# Patient Record
Sex: Female | Born: 1980 | Race: Black or African American | Hispanic: No | Marital: Single | Smoking: Current every day smoker
Health system: Southern US, Community
[De-identification: ages and names within clinical notes are randomized; demographics above are authoritative.]

## PROBLEM LIST (undated history)

## (undated) DIAGNOSIS — S82891A Other fracture of right lower leg, initial encounter for closed fracture: Secondary | ICD-10-CM

## (undated) DIAGNOSIS — Z8349 Family history of other endocrine, nutritional and metabolic diseases: Secondary | ICD-10-CM

## (undated) DIAGNOSIS — Z30013 Encounter for initial prescription of injectable contraceptive: Secondary | ICD-10-CM

## (undated) DIAGNOSIS — Z124 Encounter for screening for malignant neoplasm of cervix: Secondary | ICD-10-CM

## (undated) DIAGNOSIS — K37 Unspecified appendicitis: Secondary | ICD-10-CM

## (undated) DIAGNOSIS — Z9049 Acquired absence of other specified parts of digestive tract: Secondary | ICD-10-CM

## (undated) DIAGNOSIS — E559 Vitamin D deficiency, unspecified: Secondary | ICD-10-CM

## (undated) HISTORY — PX: CHOLECYSTECTOMY: SHX55

## (undated) HISTORY — PX: APPENDECTOMY: SHX54

## (undated) HISTORY — DX: Other fracture of right lower leg, initial encounter for closed fracture: S82.891A

## (undated) HISTORY — DX: Family history of other endocrine, nutritional and metabolic diseases: Z83.49

---
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
## 2002-05-10 ENCOUNTER — Encounter (INDEPENDENT_AMBULATORY_CARE_PROVIDER_SITE_OTHER): Payer: Self-pay | Admitting: Specialist

## 2002-05-10 ENCOUNTER — Encounter: Payer: Self-pay | Admitting: Emergency Medicine

## 2002-05-10 ENCOUNTER — Inpatient Hospital Stay (HOSPITAL_COMMUNITY): Admission: EM | Admit: 2002-05-10 | Discharge: 2002-05-14 | Payer: Self-pay | Admitting: Emergency Medicine

## 2002-06-02 ENCOUNTER — Emergency Department (HOSPITAL_COMMUNITY): Admission: EM | Admit: 2002-06-02 | Discharge: 2002-06-02 | Payer: Self-pay | Admitting: Emergency Medicine

## 2002-06-14 ENCOUNTER — Encounter: Admission: RE | Admit: 2002-06-14 | Discharge: 2002-06-14 | Payer: Self-pay | Admitting: Obstetrics and Gynecology

## 2002-10-23 ENCOUNTER — Encounter (INDEPENDENT_AMBULATORY_CARE_PROVIDER_SITE_OTHER): Payer: Self-pay | Admitting: *Deleted

## 2002-10-23 LAB — CONVERTED CEMR LAB

## 2002-11-02 ENCOUNTER — Encounter: Admission: RE | Admit: 2002-11-02 | Discharge: 2002-11-02 | Payer: Self-pay | Admitting: Family Medicine

## 2002-11-09 ENCOUNTER — Encounter: Admission: RE | Admit: 2002-11-09 | Discharge: 2002-11-09 | Payer: Self-pay | Admitting: Family Medicine

## 2002-12-06 ENCOUNTER — Encounter: Admission: RE | Admit: 2002-12-06 | Discharge: 2002-12-06 | Payer: Self-pay | Admitting: Family Medicine

## 2002-12-23 ENCOUNTER — Encounter: Admission: RE | Admit: 2002-12-23 | Discharge: 2002-12-23 | Payer: Self-pay | Admitting: Family Medicine

## 2003-01-18 ENCOUNTER — Encounter: Admission: RE | Admit: 2003-01-18 | Discharge: 2003-01-18 | Payer: Self-pay | Admitting: Family Medicine

## 2003-01-25 ENCOUNTER — Encounter: Admission: RE | Admit: 2003-01-25 | Discharge: 2003-01-25 | Payer: Self-pay | Admitting: Family Medicine

## 2003-04-06 ENCOUNTER — Encounter: Admission: RE | Admit: 2003-04-06 | Discharge: 2003-04-06 | Payer: Self-pay | Admitting: Obstetrics and Gynecology

## 2003-06-05 ENCOUNTER — Ambulatory Visit (HOSPITAL_COMMUNITY): Admission: RE | Admit: 2003-06-05 | Discharge: 2003-06-05 | Payer: Self-pay | Admitting: Obstetrics and Gynecology

## 2003-06-05 ENCOUNTER — Encounter (INDEPENDENT_AMBULATORY_CARE_PROVIDER_SITE_OTHER): Payer: Self-pay | Admitting: Specialist

## 2003-06-20 ENCOUNTER — Encounter: Admission: RE | Admit: 2003-06-20 | Discharge: 2003-06-20 | Payer: Self-pay | Admitting: Obstetrics and Gynecology

## 2003-07-11 ENCOUNTER — Encounter: Admission: RE | Admit: 2003-07-11 | Discharge: 2003-07-11 | Payer: Self-pay | Admitting: Obstetrics and Gynecology

## 2004-01-21 ENCOUNTER — Emergency Department (HOSPITAL_COMMUNITY): Admission: EM | Admit: 2004-01-21 | Discharge: 2004-01-21 | Payer: Self-pay | Admitting: Emergency Medicine

## 2004-03-14 ENCOUNTER — Emergency Department (HOSPITAL_COMMUNITY): Admission: EM | Admit: 2004-03-14 | Discharge: 2004-03-14 | Payer: Self-pay | Admitting: *Deleted

## 2004-03-15 ENCOUNTER — Emergency Department (HOSPITAL_COMMUNITY): Admission: EM | Admit: 2004-03-15 | Discharge: 2004-03-15 | Payer: Self-pay | Admitting: Emergency Medicine

## 2004-03-16 ENCOUNTER — Emergency Department (HOSPITAL_COMMUNITY): Admission: EM | Admit: 2004-03-16 | Discharge: 2004-03-16 | Payer: Self-pay | Admitting: Emergency Medicine

## 2004-03-20 ENCOUNTER — Emergency Department (HOSPITAL_COMMUNITY): Admission: EM | Admit: 2004-03-20 | Discharge: 2004-03-20 | Payer: Self-pay

## 2004-03-21 ENCOUNTER — Emergency Department (HOSPITAL_COMMUNITY): Admission: EM | Admit: 2004-03-21 | Discharge: 2004-03-21 | Payer: Self-pay | Admitting: Family Medicine

## 2004-08-22 ENCOUNTER — Emergency Department (HOSPITAL_COMMUNITY): Admission: EM | Admit: 2004-08-22 | Discharge: 2004-08-22 | Payer: Self-pay | Admitting: Emergency Medicine

## 2004-09-05 ENCOUNTER — Emergency Department (HOSPITAL_COMMUNITY): Admission: EM | Admit: 2004-09-05 | Discharge: 2004-09-05 | Payer: Self-pay | Admitting: Emergency Medicine

## 2004-09-30 ENCOUNTER — Emergency Department (HOSPITAL_COMMUNITY): Admission: EM | Admit: 2004-09-30 | Discharge: 2004-10-01 | Payer: Self-pay | Admitting: Emergency Medicine

## 2005-04-13 ENCOUNTER — Emergency Department (HOSPITAL_COMMUNITY): Admission: EM | Admit: 2005-04-13 | Discharge: 2005-04-13 | Payer: Self-pay | Admitting: Emergency Medicine

## 2005-04-24 ENCOUNTER — Ambulatory Visit: Payer: Self-pay | Admitting: Family Medicine

## 2005-04-24 ENCOUNTER — Other Ambulatory Visit: Admission: RE | Admit: 2005-04-24 | Discharge: 2005-04-24 | Payer: Self-pay | Admitting: Obstetrics and Gynecology

## 2005-04-24 ENCOUNTER — Encounter (INDEPENDENT_AMBULATORY_CARE_PROVIDER_SITE_OTHER): Payer: Self-pay | Admitting: *Deleted

## 2005-11-24 ENCOUNTER — Emergency Department (HOSPITAL_COMMUNITY): Admission: EM | Admit: 2005-11-24 | Discharge: 2005-11-24 | Payer: Self-pay | Admitting: Emergency Medicine

## 2006-02-03 ENCOUNTER — Ambulatory Visit: Payer: Self-pay | Admitting: Family Medicine

## 2006-03-10 ENCOUNTER — Ambulatory Visit: Payer: Self-pay | Admitting: Family Medicine

## 2006-03-20 ENCOUNTER — Ambulatory Visit: Payer: Self-pay | Admitting: *Deleted

## 2006-04-20 ENCOUNTER — Encounter (INDEPENDENT_AMBULATORY_CARE_PROVIDER_SITE_OTHER): Payer: Self-pay | Admitting: Family Medicine

## 2006-04-20 ENCOUNTER — Ambulatory Visit: Payer: Self-pay | Admitting: Family Medicine

## 2006-04-20 ENCOUNTER — Encounter (INDEPENDENT_AMBULATORY_CARE_PROVIDER_SITE_OTHER): Payer: Self-pay | Admitting: Specialist

## 2006-04-28 ENCOUNTER — Ambulatory Visit: Payer: Self-pay | Admitting: Family Medicine

## 2006-05-21 DIAGNOSIS — E669 Obesity, unspecified: Secondary | ICD-10-CM

## 2006-05-22 ENCOUNTER — Encounter (INDEPENDENT_AMBULATORY_CARE_PROVIDER_SITE_OTHER): Payer: Self-pay | Admitting: *Deleted

## 2006-08-09 ENCOUNTER — Emergency Department (HOSPITAL_COMMUNITY): Admission: EM | Admit: 2006-08-09 | Discharge: 2006-08-09 | Payer: Self-pay | Admitting: Emergency Medicine

## 2006-11-26 ENCOUNTER — Inpatient Hospital Stay (HOSPITAL_COMMUNITY): Admission: AD | Admit: 2006-11-26 | Discharge: 2006-11-27 | Payer: Self-pay | Admitting: Obstetrics and Gynecology

## 2006-12-09 ENCOUNTER — Encounter (INDEPENDENT_AMBULATORY_CARE_PROVIDER_SITE_OTHER): Payer: Self-pay | Admitting: *Deleted

## 2007-04-28 ENCOUNTER — Other Ambulatory Visit: Admission: RE | Admit: 2007-04-28 | Discharge: 2007-04-28 | Payer: Self-pay | Admitting: Gynecology

## 2007-08-02 ENCOUNTER — Other Ambulatory Visit: Admission: RE | Admit: 2007-08-02 | Discharge: 2007-08-02 | Payer: Self-pay | Admitting: Gynecology

## 2007-10-19 ENCOUNTER — Emergency Department (HOSPITAL_COMMUNITY): Admission: EM | Admit: 2007-10-19 | Discharge: 2007-10-19 | Payer: Self-pay | Admitting: Emergency Medicine

## 2007-12-16 ENCOUNTER — Emergency Department (HOSPITAL_COMMUNITY): Admission: EM | Admit: 2007-12-16 | Discharge: 2007-12-16 | Payer: Self-pay | Admitting: Emergency Medicine

## 2008-01-08 ENCOUNTER — Inpatient Hospital Stay (HOSPITAL_COMMUNITY): Admission: AD | Admit: 2008-01-08 | Discharge: 2008-01-08 | Payer: Self-pay | Admitting: Obstetrics & Gynecology

## 2008-05-15 ENCOUNTER — Inpatient Hospital Stay (HOSPITAL_COMMUNITY): Admission: AD | Admit: 2008-05-15 | Discharge: 2008-05-15 | Payer: Self-pay | Admitting: Obstetrics & Gynecology

## 2008-05-15 ENCOUNTER — Ambulatory Visit: Payer: Self-pay | Admitting: Obstetrics and Gynecology

## 2008-06-21 ENCOUNTER — Inpatient Hospital Stay (HOSPITAL_COMMUNITY): Admission: AD | Admit: 2008-06-21 | Discharge: 2008-06-21 | Payer: Self-pay | Admitting: Obstetrics and Gynecology

## 2008-06-23 ENCOUNTER — Inpatient Hospital Stay (HOSPITAL_COMMUNITY): Admission: AD | Admit: 2008-06-23 | Discharge: 2008-06-24 | Payer: Self-pay | Admitting: Family Medicine

## 2008-06-23 ENCOUNTER — Inpatient Hospital Stay (HOSPITAL_COMMUNITY): Admission: AD | Admit: 2008-06-23 | Discharge: 2008-06-23 | Payer: Self-pay | Admitting: Obstetrics & Gynecology

## 2008-06-26 ENCOUNTER — Ambulatory Visit: Payer: Self-pay | Admitting: Physician Assistant

## 2008-06-26 ENCOUNTER — Inpatient Hospital Stay (HOSPITAL_COMMUNITY): Admission: AD | Admit: 2008-06-26 | Discharge: 2008-06-26 | Payer: Self-pay | Admitting: Obstetrics & Gynecology

## 2008-06-28 ENCOUNTER — Inpatient Hospital Stay (HOSPITAL_COMMUNITY): Admission: AD | Admit: 2008-06-28 | Discharge: 2008-06-28 | Payer: Self-pay | Admitting: Obstetrics & Gynecology

## 2008-08-12 ENCOUNTER — Emergency Department (HOSPITAL_COMMUNITY): Admission: EM | Admit: 2008-08-12 | Discharge: 2008-08-12 | Payer: Self-pay | Admitting: Emergency Medicine

## 2008-08-23 ENCOUNTER — Inpatient Hospital Stay (HOSPITAL_COMMUNITY): Admission: AD | Admit: 2008-08-23 | Discharge: 2008-08-24 | Payer: Self-pay | Admitting: Obstetrics and Gynecology

## 2008-09-16 ENCOUNTER — Emergency Department (HOSPITAL_COMMUNITY): Admission: EM | Admit: 2008-09-16 | Discharge: 2008-09-17 | Payer: Self-pay | Admitting: Emergency Medicine

## 2009-01-16 ENCOUNTER — Inpatient Hospital Stay (HOSPITAL_COMMUNITY): Admission: AD | Admit: 2009-01-16 | Discharge: 2009-01-16 | Payer: Self-pay | Admitting: Obstetrics & Gynecology

## 2009-04-05 ENCOUNTER — Emergency Department (HOSPITAL_COMMUNITY): Admission: EM | Admit: 2009-04-05 | Discharge: 2009-04-05 | Payer: Self-pay | Admitting: Emergency Medicine

## 2009-04-21 ENCOUNTER — Emergency Department (HOSPITAL_COMMUNITY): Admission: EM | Admit: 2009-04-21 | Discharge: 2009-04-21 | Payer: Self-pay | Admitting: Emergency Medicine

## 2009-05-13 ENCOUNTER — Emergency Department (HOSPITAL_COMMUNITY): Admission: EM | Admit: 2009-05-13 | Discharge: 2009-05-13 | Payer: Self-pay | Admitting: Emergency Medicine

## 2009-06-15 ENCOUNTER — Emergency Department (HOSPITAL_COMMUNITY): Admission: EM | Admit: 2009-06-15 | Discharge: 2009-06-15 | Payer: Self-pay | Admitting: Emergency Medicine

## 2009-06-19 ENCOUNTER — Emergency Department (HOSPITAL_COMMUNITY): Admission: EM | Admit: 2009-06-19 | Discharge: 2009-06-20 | Payer: Self-pay | Admitting: Emergency Medicine

## 2010-06-08 LAB — URINALYSIS, ROUTINE W REFLEX MICROSCOPIC
Glucose, UA: NEGATIVE mg/dL
Hgb urine dipstick: NEGATIVE
Ketones, ur: 15 mg/dL — AB
Leukocytes, UA: NEGATIVE
Nitrite: NEGATIVE
Protein, ur: 30 mg/dL — AB
Specific Gravity, Urine: 1.03 (ref 1.005–1.030)
Urobilinogen, UA: 1 mg/dL (ref 0.0–1.0)
pH: 6.5 (ref 5.0–8.0)

## 2010-06-08 LAB — URINE MICROSCOPIC-ADD ON

## 2010-06-09 LAB — RAPID URINE DRUG SCREEN, HOSP PERFORMED
Amphetamines: NOT DETECTED
Barbiturates: NOT DETECTED
Benzodiazepines: NOT DETECTED
Cocaine: NOT DETECTED
Opiates: NOT DETECTED
Tetrahydrocannabinol: POSITIVE — AB

## 2010-06-09 LAB — URINALYSIS, ROUTINE W REFLEX MICROSCOPIC
Bilirubin Urine: NEGATIVE
Glucose, UA: NEGATIVE mg/dL
Hgb urine dipstick: NEGATIVE
Ketones, ur: NEGATIVE mg/dL
Nitrite: NEGATIVE
Protein, ur: NEGATIVE mg/dL
Specific Gravity, Urine: 1.021 (ref 1.005–1.030)
Urobilinogen, UA: 0.2 mg/dL (ref 0.0–1.0)
pH: 6.5 (ref 5.0–8.0)

## 2010-06-09 LAB — POCT I-STAT, CHEM 8
BUN: 6 mg/dL (ref 6–23)
Calcium, Ion: 1.21 mmol/L (ref 1.12–1.32)
Chloride: 108 mEq/L (ref 96–112)
Creatinine, Ser: 0.8 mg/dL (ref 0.4–1.2)
Glucose, Bld: 90 mg/dL (ref 70–99)
HCT: 36 % (ref 36.0–46.0)
Hemoglobin: 12.2 g/dL (ref 12.0–15.0)
Potassium: 3.6 mEq/L (ref 3.5–5.1)
Sodium: 140 mEq/L (ref 135–145)
TCO2: 25 mmol/L (ref 0–100)

## 2010-06-09 LAB — URINE MICROSCOPIC-ADD ON

## 2010-06-09 LAB — POCT PREGNANCY, URINE: Preg Test, Ur: NEGATIVE

## 2010-06-27 LAB — URINE MICROSCOPIC-ADD ON

## 2010-06-27 LAB — URINALYSIS, ROUTINE W REFLEX MICROSCOPIC
Glucose, UA: NEGATIVE mg/dL
Ketones, ur: 15 mg/dL — AB
Leukocytes, UA: NEGATIVE
Nitrite: NEGATIVE
Protein, ur: 30 mg/dL — AB
Specific Gravity, Urine: 1.02 (ref 1.005–1.030)
Urobilinogen, UA: 0.2 mg/dL (ref 0.0–1.0)
pH: 6.5 (ref 5.0–8.0)

## 2010-06-27 LAB — WET PREP, GENITAL: Yeast Wet Prep HPF POC: NONE SEEN

## 2010-06-27 LAB — GC/CHLAMYDIA PROBE AMP, GENITAL
Chlamydia, DNA Probe: NEGATIVE
GC Probe Amp, Genital: NEGATIVE

## 2010-06-27 LAB — POCT PREGNANCY, URINE: Preg Test, Ur: NEGATIVE

## 2010-07-01 LAB — WET PREP, GENITAL
Trich, Wet Prep: NONE SEEN
Yeast Wet Prep HPF POC: NONE SEEN

## 2010-07-01 LAB — GC/CHLAMYDIA PROBE AMP, GENITAL
Chlamydia, DNA Probe: NEGATIVE
GC Probe Amp, Genital: NEGATIVE

## 2010-07-02 LAB — DIFFERENTIAL
Basophils Relative: 1 % (ref 0–1)
Eosinophils Absolute: 0 10*3/uL (ref 0.0–0.7)
Eosinophils Relative: 1 % (ref 0–5)
Neutrophils Relative %: 60 % (ref 43–77)

## 2010-07-02 LAB — PREGNANCY, URINE: Preg Test, Ur: NEGATIVE

## 2010-07-02 LAB — CBC
MCHC: 31.7 g/dL (ref 30.0–36.0)
MCV: 73.9 fL — ABNORMAL LOW (ref 78.0–100.0)
Platelets: 239 10*3/uL (ref 150–400)
RDW: 15.3 % (ref 11.5–15.5)

## 2010-07-02 LAB — BASIC METABOLIC PANEL
BUN: 8 mg/dL (ref 6–23)
CO2: 25 mEq/L (ref 19–32)
Chloride: 107 mEq/L (ref 96–112)
Creatinine, Ser: 0.86 mg/dL (ref 0.4–1.2)
Glucose, Bld: 90 mg/dL (ref 70–99)

## 2010-07-02 LAB — AMMONIA: Ammonia: 22 umol/L (ref 11–35)

## 2010-07-03 LAB — CBC
HCT: 33.4 % — ABNORMAL LOW (ref 36.0–46.0)
HCT: 34.1 % — ABNORMAL LOW (ref 36.0–46.0)
Hemoglobin: 10.6 g/dL — ABNORMAL LOW (ref 12.0–15.0)
Hemoglobin: 10.9 g/dL — ABNORMAL LOW (ref 12.0–15.0)
MCV: 74 fL — ABNORMAL LOW (ref 78.0–100.0)
Platelets: 266 10*3/uL (ref 150–400)
RBC: 4.47 MIL/uL (ref 3.87–5.11)
RDW: 15.9 % — ABNORMAL HIGH (ref 11.5–15.5)
RDW: 15.9 % — ABNORMAL HIGH (ref 11.5–15.5)
WBC: 5.9 10*3/uL (ref 4.0–10.5)

## 2010-07-03 LAB — DIFFERENTIAL
Basophils Absolute: 0.1 10*3/uL (ref 0.0–0.1)
Eosinophils Relative: 1 % (ref 0–5)
Lymphocytes Relative: 30 % (ref 12–46)
Lymphs Abs: 1.8 10*3/uL (ref 0.7–4.0)
Monocytes Absolute: 0.5 10*3/uL (ref 0.1–1.0)
Monocytes Relative: 9 % (ref 3–12)
Neutro Abs: 3.5 10*3/uL (ref 1.7–7.7)

## 2010-07-03 LAB — URINALYSIS, ROUTINE W REFLEX MICROSCOPIC
Bilirubin Urine: NEGATIVE
Ketones, ur: 15 mg/dL — AB
Nitrite: NEGATIVE
pH: 7 (ref 5.0–8.0)

## 2010-07-03 LAB — URINE MICROSCOPIC-ADD ON

## 2010-07-03 LAB — HCG, QUANTITATIVE, PREGNANCY: hCG, Beta Chain, Quant, S: 111 m[IU]/mL — ABNORMAL HIGH (ref <5)

## 2010-07-03 LAB — BASIC METABOLIC PANEL
Calcium: 9 mg/dL (ref 8.4–10.5)
GFR calc Af Amer: >60 mL/min (ref >60)
GFR calc non Af Amer: >60 mL/min (ref >60)
Glucose, Bld: 89 mg/dL (ref 70–99)
Potassium: 3.5 mEq/L (ref 3.5–5.1)
Sodium: 135 mEq/L (ref 135–145)

## 2010-07-04 LAB — URINE MICROSCOPIC-ADD ON

## 2010-07-04 LAB — POCT PREGNANCY, URINE: Preg Test, Ur: POSITIVE

## 2010-07-04 LAB — URINE CULTURE: Colony Count: 45000

## 2010-07-04 LAB — CBC
HCT: 35.4 % — ABNORMAL LOW (ref 36.0–46.0)
MCV: 74.7 fL — ABNORMAL LOW (ref 78.0–100.0)
Platelets: 288 10*3/uL (ref 150–400)
RBC: 4.74 MIL/uL (ref 3.87–5.11)
WBC: 5.5 10*3/uL (ref 4.0–10.5)

## 2010-07-04 LAB — URINALYSIS, ROUTINE W REFLEX MICROSCOPIC
Bilirubin Urine: NEGATIVE
Glucose, UA: NEGATIVE mg/dL
Specific Gravity, Urine: >1.03 — ABNORMAL HIGH (ref 1.005–1.030)
Urobilinogen, UA: 0.2 mg/dL (ref 0.0–1.0)
pH: 5.5 (ref 5.0–8.0)

## 2010-07-04 LAB — HCG, QUANTITATIVE, PREGNANCY: hCG, Beta Chain, Quant, S: 55 m[IU]/mL — ABNORMAL HIGH (ref <5)

## 2010-07-04 LAB — WET PREP, GENITAL: Clue Cells Wet Prep HPF POC: NONE SEEN

## 2010-07-04 LAB — GC/CHLAMYDIA PROBE AMP, GENITAL: Chlamydia, DNA Probe: NEGATIVE

## 2010-07-09 LAB — GC/CHLAMYDIA PROBE AMP, GENITAL: Chlamydia, DNA Probe: NEGATIVE

## 2010-07-09 LAB — URINALYSIS, ROUTINE W REFLEX MICROSCOPIC
Bilirubin Urine: NEGATIVE
Ketones, ur: NEGATIVE mg/dL
Nitrite: NEGATIVE
Specific Gravity, Urine: 1.025 (ref 1.005–1.030)
Urobilinogen, UA: 0.2 mg/dL (ref 0.0–1.0)
pH: 5.5 (ref 5.0–8.0)

## 2010-07-09 LAB — WET PREP, GENITAL

## 2010-08-09 NOTE — Op Note (Signed)
NAME:  [name], [name]                         ACCOUNT NO.:  [id number]   MEDICAL RECORD NO.:  [id number]                   PATIENT TYPE:  AMB   LOCATION:  SDC                                  FACILITY:  [facility]   PHYSICIAN:  Phil D. Okey Dupre, M.D.                  DATE OF BIRTH:  January 27, 1981   DATE OF PROCEDURE:  06/05/2003  DATE OF DISCHARGE:                                 OPERATIVE REPORT   PREOPERATIVE DIAGNOSIS:  Cervical dysplasia.   POSTOPERATIVE DIAGNOSIS:  Pending pathology.   PROCEDURE:  Loop electrosurgical excision procedure conization of the  cervix.   SURGEON:  Javier Glazier. Okey Dupre, M.D.   ANESTHESIA:  MAC plus local.   ESTIMATED BLOOD LOSS:  Less than 5 mL.   POSTOPERATIVE CONDITION:  Satisfactory.   The procedure went as follows:  Under satisfactory MAC anesthesia, the  patient in the dorsal lithotomy position, the perineum and vagina were  prepped and draped in the usual sterile manner.  An insulated Graves  speculum was placed into the vagina so that the cervix was in the middle of  the viewing area and easily accessible and treated with 3% acetic acid and  using 1 mL in each area of four areas at 10, 2, 4, and 8 o'clock on the  cervix was injected 1% Xylocaine with 1:100,000 epinephrine.  A 2 x 2 LEEP  loop was used to do a LEEP biopsy, which was done and sent for pathologic  diagnosis.  The area was then treated around the entire perimeter of the  biopsy site with a ball coagulation set at 50.  The biopsy was set at a  blend 1 of 60, and any bleeding areas were coagulated with the hot cautery.  The area was then packed and held for five minutes with Monsel's solution.  There was no significant bleeding.  The patient was sent to the recovery  room in satisfactory condition after the speculum removed.                                               Phil D. Okey Dupre, M.D.    PDR/MEDQ  D:  06/05/2003  T:  06/06/2003  Job:  [id number]

## 2010-08-09 NOTE — H&P (Signed)
NAME:  [name], [name]                         ACCOUNT NO.:  [id number]   MEDICAL RECORD NO.:  [id number]                   PATIENT TYPE:  INP   LOCATION:  1824                                 FACILITY:  [facility]   PHYSICIAN:  Sandria Bales. Ezzard Standing, M.D.               DATE OF BIRTH:  10-30-1980   DATE OF ADMISSION:  05/10/2002  DATE OF DISCHARGE:                                HISTORY & PHYSICAL   HISTORY OF PRESENT ILLNESS:  She is a 30 year old black female, who has no  primary medical doctor, who was brought to the emergency room by ambulance  because of abdominal pain.   Her symptoms began [facility] the [facility].  She does not know the physician who is  involved.  She started having pain that evening.  She had some diarrhea  first and then started vomiting.  Then she had increasing abdominal pain.  It is a little unclear to me why she did not come to the emergency room  earlier.  Anyway, she presented today around noon via ambulance.  She said  when she hit bumps her belly hurts.  She has had no prior history of peptic  ulcer disease, liver disease, pancreatitis disease.  She has had no prior  abdominal surgery.  Denies any prior pregnancies.  Last menstrual period was  nine days ago.   ALLERGIES:  She has no allergies.   MEDICATIONS:  No medications.   REVIEW OF SYMPTOMS:  PULMONARY:  Does not smoke cigarettes.  No history of  pneumonia or tuberculosis.  CARDIAC:  She has no history of heart disease or  chest pain.  GASTROINTESTINAL:  See history of present illness.  UROLOGIC:  No kidney stones or kidney infections.  GYNECOLOGIC:  She is a nullipara  female and her last period was seven days ago.  I spoke to her mother by  phone but she had nobody with her.   PHYSICAL EXAMINATION:  VITAL SIGNS:  Temperature is 98.4, blood pressure is  107/42, pulse is 121, respirations are 20.  GENERAL:  She is an obese black  female.  HEENT:  Unremarkable.  NECK:  Supple.  I feel no thyromegaly.  LUNGS:  Clear to auscultation.  HEART:  Regular rate and rhythm.  ABDOMEN:  She has absent bowel sounds with maybe to minimal in the left  upper quadrant.  She has some diffuse tenderness more localized in the lower  abdomen.  It was a little hard for me to know whether her right lower  quadrant or left lower quadrant is more tender.  PELVIC:  She had a pelvic exam in the emergency room before I came there.  Apparently, no gynecologic findings.  EXTREMITIES:  She has good strength in all four extremities.   LABORATORY DATA:  Urine pregnancy which was negative.  She had a urinalysis  which was negative.  She had a sodium 133, potassium 3.9, chloride of 103,  CO2 20.  Her creatinine is 1.3.  She does not have a CBC on the chart.   I reviewed her CAT scan which was read as appendicitis.  Actually, her  appendix is more prominent .  She has sort of light areas over her small  bowel.   IMPRESSION:  My impression is either this is early appendicitis versus  gastroenteritis.  I really think I am a little bit stuck with the diagnosis  of possible appendicitis by her CT scan.  She would be best served by having  attempted appendectomy with laparoscopic open.  There is also a chance that  this could be a bad gastroenteritis.  I think she is best served probably  with proceeding with appendectomy because of the suggestion of appendicitis  by CT scan.  I have discussed the indication to surgery, potential  complications including but not limited to infection, bleeding, having to do  the surgery open, and having another diagnosis as actually the cause of her  symptoms. I again spoke to her mother by phone while she was in the room.  I  am not really sure why but her mother is not going to come to the hospital  as she has to work tomorrow.                                               Sandria Bales. Ezzard Standing, M.D.    DHN/MEDQ   D:  05/10/2002  T:  05/10/2002  Job:  [id number]

## 2010-08-09 NOTE — Discharge Summary (Signed)
[facility]  Patient:    [name], [name]                        MRN: [id number] Adm. Date:  10/07/00 Disc. Date: 10/09/00 Attending:  Farley Ly, M.D. Dictator:   Jennette Kettle, M.D. CCEnrigue Catena H. Pollyann Kennedy, M.D.   Discharge Summary  DISCHARGE DIAGNOSES: 1. Left submandibular/submental neck mass, likely infectious area. 2. 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 b.i.d. x 11 days, ending on October 20, 2000.  FOLLOW-UP:  A follow-up appointment will be made with Dr. Pollyann Kennedy (ears, nose, and throat physician).  Follow-up will be made with Dr. Pollyann Kennedy on Monday for follow-up in one to two weeks in their clinic office.  The patient will be telephoned on Monday, October 12, 2000, at home with the follow-up appointment dates.  The patients telephone number is [phone].  PROCEDURES:  None.  CONSULTATIONS:  Dr. Pollyann Kennedy (ENT physician).  HISTORY OF PRESENT ILLNESS:  This is an 30 year old African-American female who was admitted on October 07, 2000, with an increasing left neck swelling.  The patient states that she first noticed a knot on her neck approximately five days prior to admission underneath her jaw.  The patient states that on Monday, September 28, 2000, she used a stick of "chapstick," which caused her mouth to break out.  The patient states that sores erupted that were pustular in nature and "itchy."  By Friday, October 02, 2000, the patient noticed the knot on her neck.  The patient was seen at [facility] on Monday, October 05, 2000, where a CT was performed.  The CT results revealed an enlarged node adjacent to the left submandibular gland.  There was a small amount of inflammatory stranding around the submandibular gland on the left.  This region corresponded to the patients palpable abnormality.  There was also a 1.2 cm nodule in the sublingual space which was low in attenuation  centrally. It was the radiologists impression that this could represent a necrotic lymph node versus a sublingual gland.  The patient was discharged from [facility] on Keflex.  The patient states that she was able to take her evening dose of Keflex on October 05, 2000.  However, she subsequently was not able to take her antibiotics on Tuesday, October 06, 2000, secondary to increased pain while swallowing.  The patient was seen by Dr. Rhea Pink, orofacial maxillary surgeon on Wednesday, October 07, 2000.  The patient was told to get a re-CT scan done at [facility].  The patient went to [facility], where a CT was performed with the impression being inflammatory changes in the submental space which are slightly increased from the prior study.  There were no focal abscesses seen.  The lymph nodes were stable, without interval enlargement.  There was no significant mass effect on the airway.  The patient was again discharged from [facility].  The patient subsequently came to [facility] on the evening of October 07, 2000, complaining of difficulty swallowing her medications and pain while swallowing.  The patient denies any nausea, vomiting, diarrhea, fevers, chills, or night sweats.  She denied any history of rash, shortness of breath, or chest pain.  The patient had not had recent dental work performed.  The patient was given 1 g of Rocephin in the emergency room here  before being admitted to the floor.  In addition to the CT scan that was performed at [facility] on October 05, 2000, the patient had a Panorex performed at [facility], with the impression being no periapical abscesses seen.  ADMISSION LABORATORY DATA:  CBC revealed white blood count of 6.9, hemoglobin 11.7, platelets 410, with no left shift.  Blood cultures were obtained x 2, and preliminary results were negative after two days.  Electrolytes revealed a sodium of 137, potassium  3.5, chloride of 105, bicarbonate 23, BUN 5, creatinine 0.9, glucose 98.  The patient inadvertently had cardiac enzymes drawn, with a CK of 139, a CK-MB of 1.9, which are within normal limits.  The patient had initial negative urine pregnancy test.  HOSPITAL COURSE: #1 - LEFT NECK MASS:  The patient was admitted to the hospital for IV antibiotic treatment secondary to the patients inability to swallow over the previous two to three days.  The patient was given Unasyn 1.5 g IV q.6h.  Over her two-day hospitalization stay the patient was able to tolerate her IV antibiotics and was able to increase her p.o. intake.  Upon discharge, the patient reports no difficulty swallowing and is able to eat a full meal, which includes a "chicken sandwich."  The patient states that she, on the day of discharge, is ready to go home.  In addition to starting IV antibiotics, the patient was started on D-5 half normal saline IV fluids at 150 cc/hr initially.  This was decreased down to a maintenance dose of D-5 half normal saline on day #2 of admission.  Dr. Pollyann Kennedy, ENT, was consulted on day #2 of admission.  The patient was seen by Dr. Pollyann Kennedy, with the recommendations being that the patient can continue on p.o. antibiotics for one to two weeks and to follow up with them in one to two weeks.  A follow-up appointment has not been set up at this time but will be set on Monday, October 12, 2000, and telephoned to the patient at telephone number [phone].  #2 - NONCOMPLIANCE:  The patients mother states that the patient likely will not be noncompliant with her p.o. antibiotics at home.  This issue was discussed with the patient at length, and the patient states that she will be able to take her Augmentin twice a day by mouth.  The risks of further worsening infection were outlined to the patient.  The mother of the patient also states that she will help remind the patient to take her p.o. antibiotics as prescribed.   The patient was given a prescription for a further 11-day  treatment with Augmentin 875 p.o. b.i.d.  The patient completed her last dose of Unasyn for the day this evening and will begin her p.o. antibiotics tomorrow morning.  FOLLOW-UP:  The patient will be notified on Monday regarding follow-up visit with Dr. Pollyann Kennedy in ENT. DD:  10/09/00 TD:  10/12/00 Job: [id number]

## 2010-08-09 NOTE — Discharge Summary (Signed)
NAME:  [name], [name]                         ACCOUNT NO.:  [id number]   MEDICAL RECORD NO.:  [id number]                   PATIENT TYPE:  INP   LOCATION:  5708                                 FACILITY:  [facility]   PHYSICIAN:  Sandria Bales. Ezzard Standing, M.D.               DATE OF BIRTH:  1981/01/15   DATE OF ADMISSION:  05/10/2002  DATE OF DISCHARGE:  05/14/2002                                 DISCHARGE SUMMARY   DISCHARGE DIAGNOSES:  1. Acute periappendicitis.  2. Gonococcus probe positive.   OPERATION PERFORMED:  The patient had a laparoscopic appendectomy on  May 11, 2002.   HOSPITAL COURSE:  This is a 30 year old black female who has no identified  primary physician who presented with a four day history of diarrhea and  vomiting. She came to the emergency room by ambulance. She had a CT scan  which suggested appendicitis. [facility] on May 06, 2002, just four  days prior to presentation.   Her temperature is 98.4, pulse 125. Her abdomen was diffusely tender with a  right lower quadrant being worse than the left lower quadrant. Though I  thought the presentation was somewhat atypical, the CT scan did suggest  appendicitis. Discussed with the patient the indications and complications  of the operation.   She was taken to the operating room where she underwent a laparoscopic  appendectomy. She did have an externally inflamed appendix, but she had  purulence within her pelvis which I really thought was probably more  secondary to pelvic inflammatory disease than I did to her appendicitis.   Postoperatively, I did keep her on the Mefoxin as the antibiotic. She was  started on clear liquids the first postoperative day and then advanced to a  diet. By the third postoperative day, she was afebrile, tolerating a diet.  Her abdomen was much better, and she is ready for discharge.   She did turn out to be GC  positive, and a copy of this report was given to  the patient by Dr. Maryagnes Amos who was covering for me on the day of discharge.  She was given Cipro 500 mg to stay on for continuation of five more days  which would be a total of seven days of antibiotics. She was given a copy of  her lab report that showed gonorrhea. She was supposed to have followup  either with her own medical doctor, which I do not think she had had one, or  with the [facility] which actually she had been to just four  days prior to her presentation, so that would be an easy place for follow  up. She is going to see me back in two weeks in the office and call for any  problems.   DISCHARGE CONDITION:  Improved.  Sandria Bales. Ezzard Standing, M.D.    DHN/MEDQ  D:  09/19/2002  T:  09/19/2002  Job:  [id number]

## 2010-08-09 NOTE — Op Note (Signed)
NAME:  [name], [name]                         ACCOUNT NO.:  [id number]   MEDICAL RECORD NO.:  [id number]                   PATIENT TYPE:  INP   LOCATION:  1824                                 FACILITY:  [facility]   PHYSICIAN:  [name], M.D.               DATE OF BIRTH:  September 03, 1980   DATE OF PROCEDURE:  05/10/2002  DATE OF DISCHARGE:                                 OPERATIVE REPORT   PREOPERATIVE DIAGNOSIS:  Appendicitis with ileus.   POSTOPERATIVE DIAGNOSIS:  Early appendicitis and purulence within the  abdominal cavity, possible early pelvic inflammatory disease though fairly  unremarkable tubes and ovaries.   PROCEDURE:  Laparoscopic appendectomy with laparoscopic exploration.   SURGEON:  [name], M.D.   FIRST ASSISTANT:  None.   ANESTHESIA:  General endotracheal.   ESTIMATED BLOOD LOSS:  Minimal.   INDICATIONS FOR PROCEDURE:  [name] is a 30 year old black female who  presents with a four day history of nausea, vomiting, diarrhea, a white  blood count of about 19,000, tender abdomen with a CT scan showing a dilated  appendix consistent with possible early appendicitis.   Discussion was carried out with the patient by phone with her mom about the  need for operative intervention with attempt at laparoscopic appendectomy.  Discussed the indications and potential risks. The potential risks include  but are not limited to infection, bleeding, open abdominal surgery and other  diagnosis.   DESCRIPTION OF PROCEDURE:  The patient was taken to the operating room where  she underwent abdominal exploration laparoscopically. Both her arms were  tucked. She had a Foley catheter in place, oral gastric tube in place. Dr.  Sharee Holster was the supervising anesthesiologist. She had 2 g of ______  at the initiation of the procedure.   The abdomen was shaved, prepped with Betadine solution and sterilely draped.  I made three incisions, an infraumbilical incision  where I placed a 10 mm  Hasson trocar and secured this with a #0 Vicryl suture inserted this into  the abdominal cavity. A zero degree laparoscope was then inserted into the  abdominal cavity. I placed a 5 mm Ethicon trocar on the right upper quadrant  and a 10 mm Ethicon trocar in the left lower quadrant. I then did a  laparoscopic exploration. She had some purulence which I saw first up over  the liver, some interloop and some down. Her pelvis is fairly thin, it was  fairly minimal and the bowel itself was erythematous looking. My initial  impression is that she probably had PID and that is what this looked like.   I then turned my attention to the appendix. The appendix, however, also  looked turgid and was slightly inflamed looking. I do not whether this was  from external appearance or whether this was actually a primary process but  I went and I took down the mesentery of the appendix  using a harmonic  scalpel. I then ligated the base of the appendix using EndoGIA stapler  vascular. I placed the appendix in a bag and delivered it through the  umbilicus and sent it to pathology. I then evaluated her uterus which was  externally inflamed. Both her tubes and ovary were actually fairly  unremarkable. She had a little bit of purulence on her left tube but I am  not sure whether this was just from the stuff that was in her belly or  whether this was a primary process. I took pictures and the picture actually  turned out to be fairly light colored and they were not all that good.   I then ran her bowel from the ligament of Treitz back looking for a Meckel's  diverticula, I found none. Again she had an inflamed looking small bowel  with some very thin yellowish purulence between the loops of bowel but no  obvious perforation or other injury and no evidence of a Meckel's. I then  went and looked over her liver and gallbladder was unremarkable. The  anterior wall of her stomach was   unremarkable. I could visualize part of  her colon and that was unremarkable. I then spent about 15 or 20 minutes  using about 3-4 liters of irrigant to irrigate her pelvis, both colonic  gutters and over her liver until this was fairly clear fluid. I saw no other  unusual materials to explain her inflammation.   I am having a little trouble putting her picture together. My initial  impression when I did her laparoscopy exam was that she had PID. I did think  she had some abnormal appearance to her appendix but could not really find  much wrong with her tubes on either side.   At the completion of the operation, I thought I cleaned out her abdomen  well. I ruled out all major potential causes of this peritonitis. I then  took out the trocars under direct visualization, closed her umbilical trocar  site with a 3-0 Vicryl suture, placed 5-0 subcuticular Vicryl sutures at  each trocar. I will keep her n.p.o., place her IV antibiotics and watch her.                                               [name], M.D.    DHN/MEDQ  D:  05/11/2002  T:  05/11/2002  Job:  [id number]

## 2010-08-09 NOTE — Consult Note (Signed)
[facility]  Patient:    [name], CALIXTE                      MRN: [id number] Proc. Date: 10/05/00 Adm. Date:  [id number] Attending:  Devoria Albe                          Consultation Report  REASON FOR CONSULTATION:  Neck swelling.  HISTORY OF PRESENT ILLNESS:  This is an 30 year old with a history of swelling around the jaw for several days.  She had never had problems like this before. She denies any known dental problems.  She underwent a CT scan of the neck in the emergency department which revealed inflammatory changes of the left submandibular gland and a small, possibly necrotic, centered submental lymph node.   An 30 year old with a several-day history of soreness and swelling around the neck area.  She had not seen any physician for this prior to this evening.  PAST MEDICAL HISTORY:  Negative.  SOCIAL HISTORY:  Negative for tobacco or alcohol use.  PHYSICAL EXAMINATION:  GENERAL:  She is a healthy-appearing young lady in no respiratory distress.  VITAL SIGNS:  Low-grade fever 99.4.  Otherwise, stable vital signs.  NECK:  Fullness and swelling of the submental and left submandibular areas without any fluctuance or erythema of the skin or any evidence of abscess.  HEENT:  Oral cavity and pharynx reveals mild trismus but no evidence of floor of mouth edema.  No evidence of any ulcerations or masses.  She does have tenderness to percussion of the left upper second molar.  The remainder of the dentition seemed to be in reasonably good health.  Nasal and ear exams are clear.  LABORATORY DATA:  The CT scan was reviewed.  A Panorex was performed and there was no obvious periapical abscess seen, either upper or lower dentition.  IMPRESSION:  Submental and submandibular lymphadenitis.  We will start her on some antibiotics.  This could possibly represent inflammatory changes consistent with a dental source of infection.  We will see how she  responds to Augmentin 875 mg b.i.d. for several days and then we will see if we need to have her evaluated urgently by a dentist or oral surgeon.  We will have her follow up in 48 hours or sooner if she gets any worse or develops any difficulty swallowing or breathing.  In the meantime, we have recommended that she get lots of rest and drink lots of fluids, taking antibiotics as prescribed. DD:  10/06/00 TD:  10/06/00 Job: [id number]

## 2010-08-09 NOTE — Group Therapy Note (Signed)
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  She had been followed afterwards by the [facility].  Continued to have atypical Pap smears and had a colposcopy just recently  which confirmed moderate dysplasia by biopsy and the patient was examined  today and found to have a quite large lesion on the cervix which gives a  risk of bleeding in the clinic, therefore, I would prefer to do this through  a LEEP procedure in the operating room where control and suturing might be  much easier to do.  The patient understands this.  We have had her watch the  film on LEEP.  Discussed the possible complications with the patient.  She  has no other serious risk factors.   DIAGNOSES:  Moderate dysplasia CIN II.                                               Argentina Donovan, MD    PR/MEDQ  D:  04/06/2003  T:  04/06/2003  Job:  [id number]

## 2010-08-30 IMAGING — CT CT HEAD W/O CM
1 series · 16 of 30 positions shown, 20 images · non-contrast
Comparison: None.

CLINICAL DATA: Seizure occurring yesterday.  Small abrasion on
left forehead.

CT HEAD WITHOUT CONTRAST
TECHNIQUE: Contiguous axial images were obtained from the base of
the skull through the vertex without contrast

[Series 2: head trauma 4.8 h37s · axial · 0.43mm/px · z∈[-152,-23]mm · 16 of 30 slices shown, 20 images]
[im 2/30  brain]
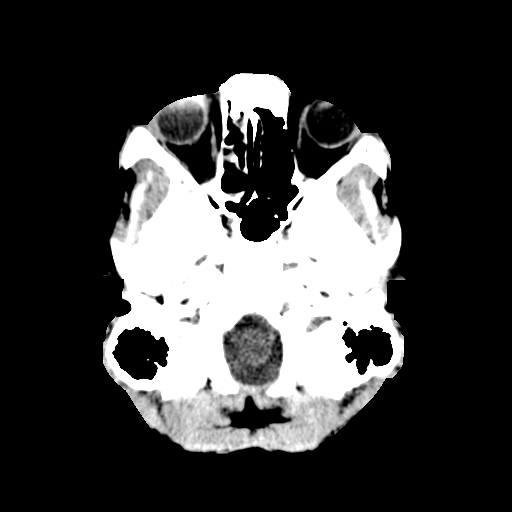
[im 2/30  bone]
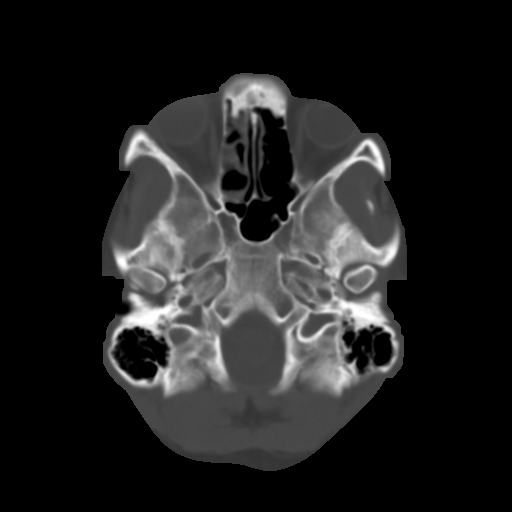
[im 4/30  brain]
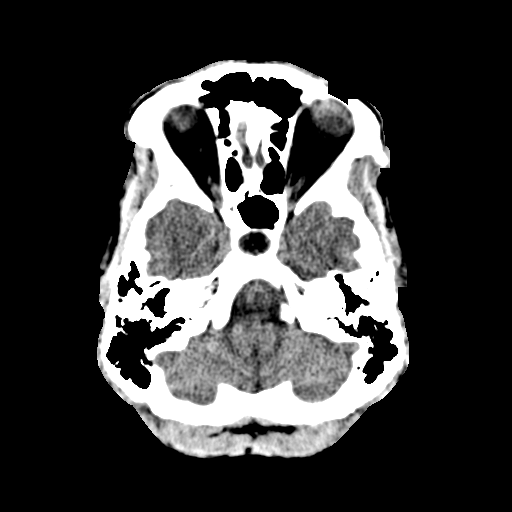
[im 6/30  brain]
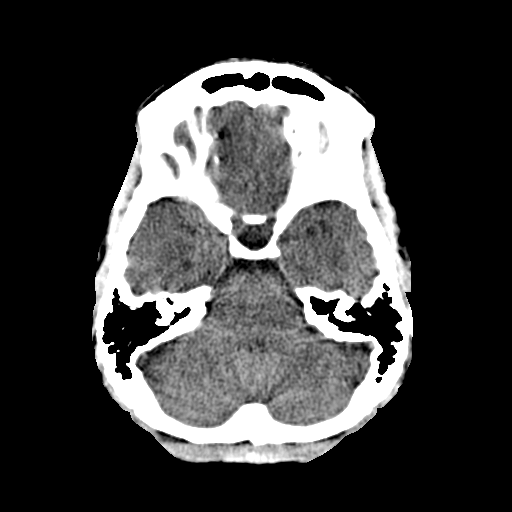
[im 8/30  brain]
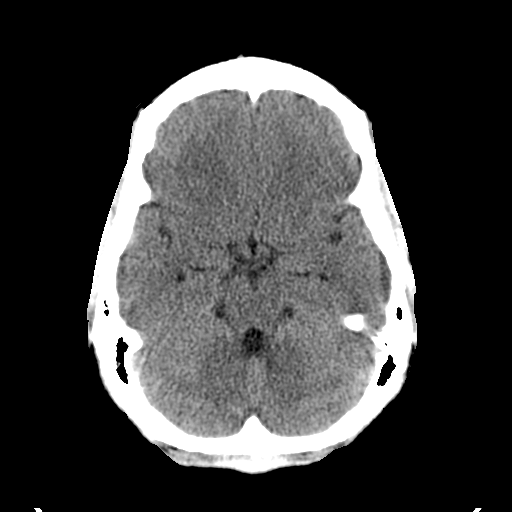
[im 9/30  brain]
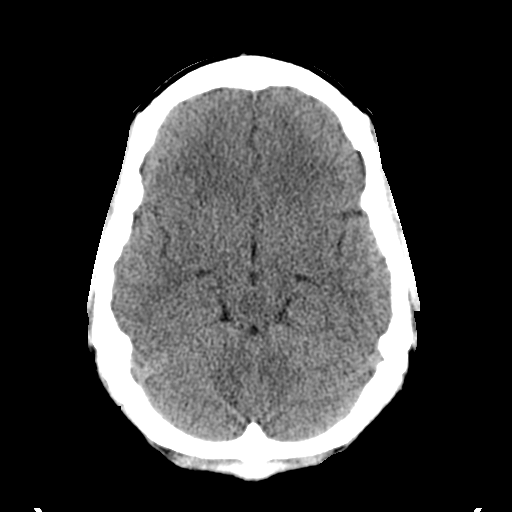
[im 9/30  bone]
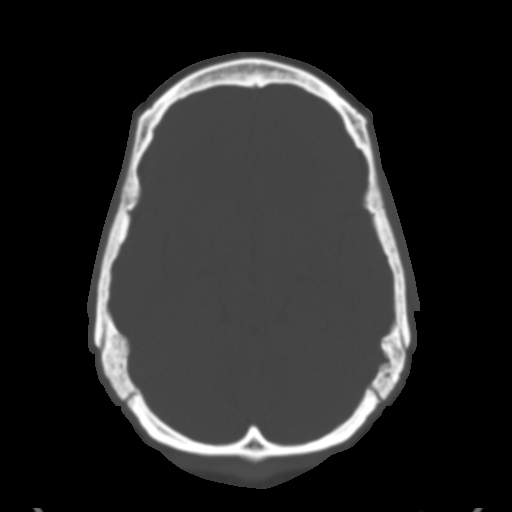
[im 11/30  brain]
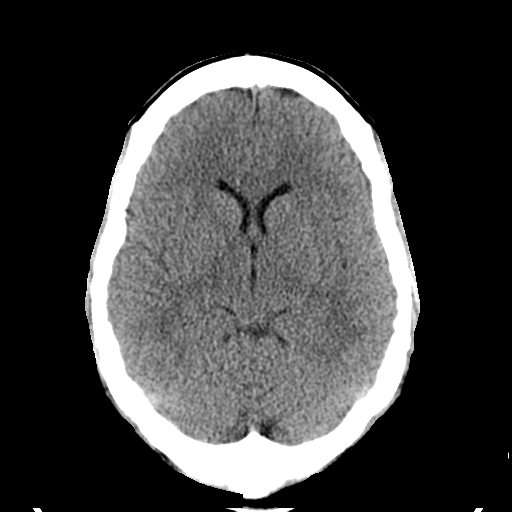
[im 13/30  brain]
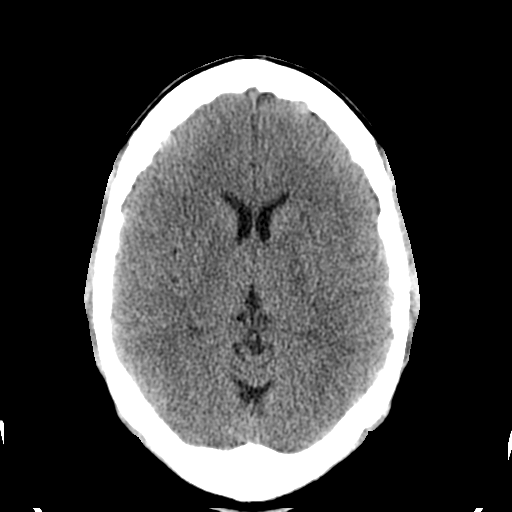
[im 15/30  brain]
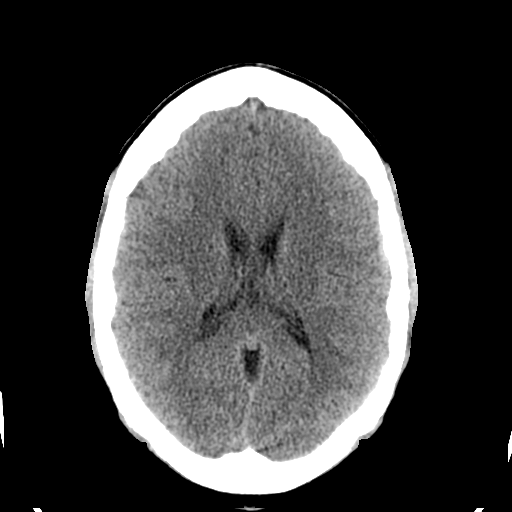
[im 16/30  brain]
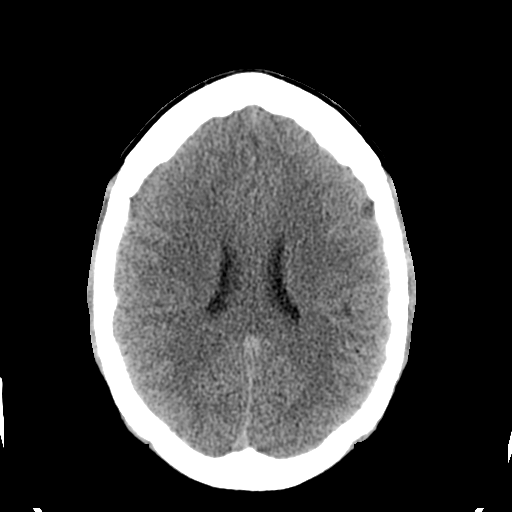
[im 16/30  bone]
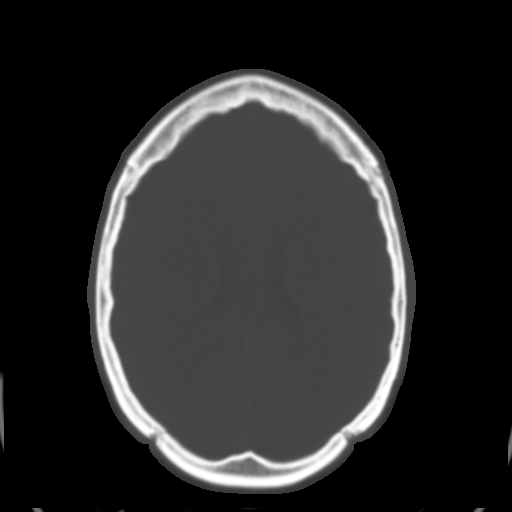
[im 18/30  brain]
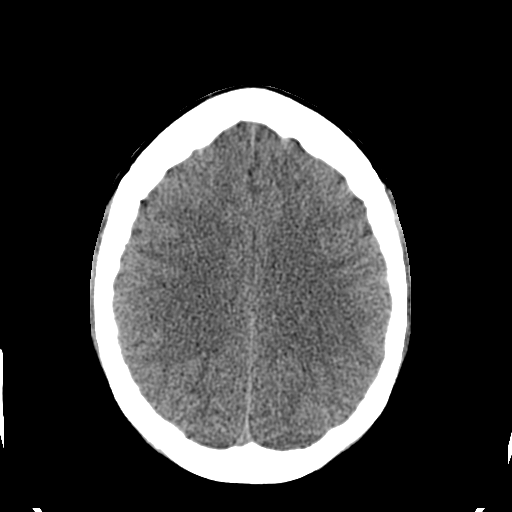
[im 20/30  brain]
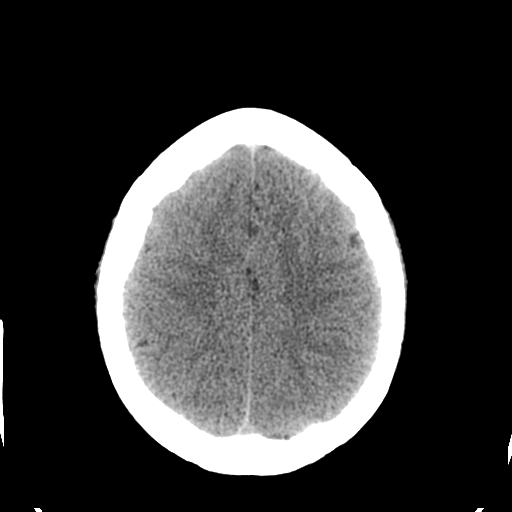
[im 22/30  brain]
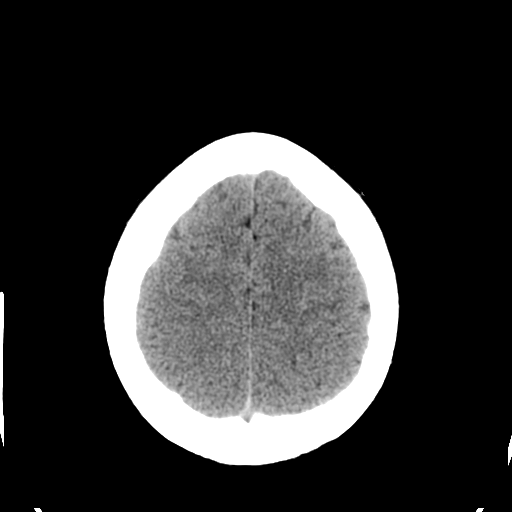
[im 23/30  brain]
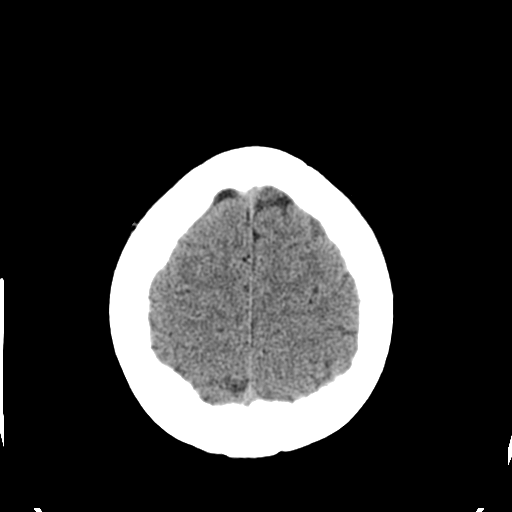
[im 23/30  bone]
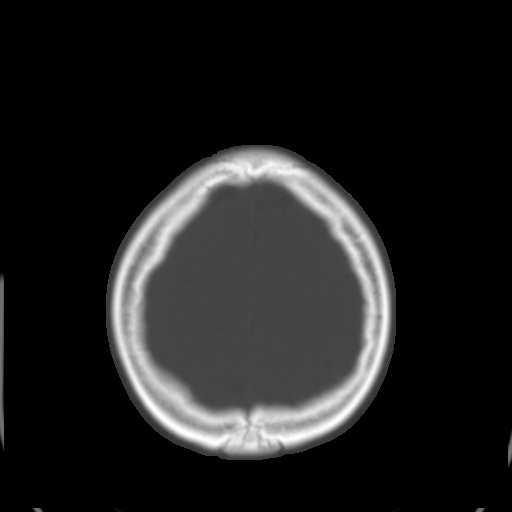
[im 25/30  brain]
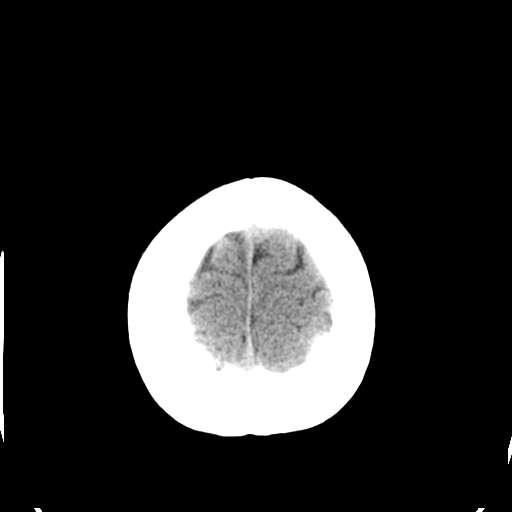
[im 27/30  brain]
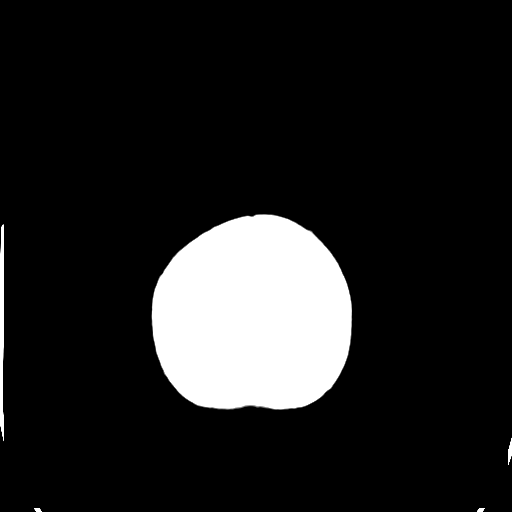
[im 29/30  brain]
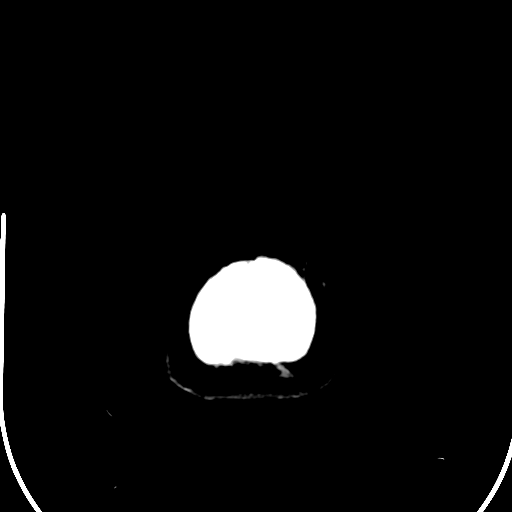

[16 of 30 positions shown; findings below may reference images not displayed]

FINDINGS: The brain has a normal appearance without evidence for
hemorrhage, acute infarction, hydrocephalus, or mass lesion.  There
is no extra axial fluid collection.  The skull is normal.  There is
mild ethmoid and maxillary paranasal sinus disease.
IMPRESSION: Normal CT of the head without contrast.

## 2010-12-23 LAB — URINALYSIS, ROUTINE W REFLEX MICROSCOPIC
Bilirubin Urine: NEGATIVE
Glucose, UA: NEGATIVE
Hgb urine dipstick: NEGATIVE
Nitrite: NEGATIVE
Nitrite: NEGATIVE
Protein, ur: NEGATIVE
Specific Gravity, Urine: 1.012
Specific Gravity, Urine: 1.015
Urobilinogen, UA: 0.2
pH: 6

## 2010-12-23 LAB — WET PREP, GENITAL
Trich, Wet Prep: NONE SEEN
Yeast Wet Prep HPF POC: NONE SEEN

## 2010-12-23 LAB — ABO/RH: ABO/RH(D): O POS

## 2010-12-23 LAB — POCT I-STAT, CHEM 8
BUN: 10
Calcium, Ion: 1.23
Chloride: 106
Creatinine, Ser: 1.2
Glucose, Bld: 80
TCO2: 24

## 2010-12-23 LAB — CBC
HCT: 38.5
Hemoglobin: 12.1
MCHC: 31.5
RBC: 5.19 — ABNORMAL HIGH

## 2010-12-23 LAB — POCT PREGNANCY, URINE: Preg Test, Ur: POSITIVE

## 2010-12-23 LAB — URINE MICROSCOPIC-ADD ON

## 2011-01-03 LAB — WET PREP, GENITAL
Clue Cells Wet Prep HPF POC: NONE SEEN
Trich, Wet Prep: NONE SEEN
Yeast Wet Prep HPF POC: NONE SEEN

## 2011-01-03 LAB — URINALYSIS, ROUTINE W REFLEX MICROSCOPIC
Glucose, UA: NEGATIVE
Ketones, ur: NEGATIVE
Protein, ur: NEGATIVE

## 2011-01-03 LAB — CBC
Hemoglobin: 11.4 — ABNORMAL LOW
RBC: 4.97

## 2011-01-03 LAB — POCT PREGNANCY, URINE
Operator id: 27769
Preg Test, Ur: NEGATIVE

## 2011-01-03 LAB — GC/CHLAMYDIA PROBE AMP, GENITAL: Chlamydia, DNA Probe: NEGATIVE

## 2017-10-22 DIAGNOSIS — Z9049 Acquired absence of other specified parts of digestive tract: Secondary | ICD-10-CM

## 2017-10-22 HISTORY — DX: Acquired absence of other specified parts of digestive tract: Z90.49

## 2017-12-15 ENCOUNTER — Emergency Department (HOSPITAL_COMMUNITY): Payer: Self-pay

## 2017-12-15 ENCOUNTER — Encounter (HOSPITAL_COMMUNITY): Payer: Self-pay

## 2017-12-15 ENCOUNTER — Other Ambulatory Visit: Payer: Self-pay

## 2017-12-15 ENCOUNTER — Emergency Department (HOSPITAL_COMMUNITY)
Admission: EM | Admit: 2017-12-15 | Discharge: 2017-12-15 | Disposition: A | Discharge status: Home / Self Care | Payer: Self-pay | Attending: Emergency Medicine | Admitting: Emergency Medicine

## 2017-12-15 DIAGNOSIS — N760 Acute vaginitis: Secondary | ICD-10-CM | POA: Insufficient documentation

## 2017-12-15 DIAGNOSIS — B9689 Other specified bacterial agents as the cause of diseases classified elsewhere: Secondary | ICD-10-CM | POA: Insufficient documentation

## 2017-12-15 DIAGNOSIS — N939 Abnormal uterine and vaginal bleeding, unspecified: Secondary | ICD-10-CM | POA: Insufficient documentation

## 2017-12-15 DIAGNOSIS — A599 Trichomoniasis, unspecified: Secondary | ICD-10-CM | POA: Insufficient documentation

## 2017-12-15 LAB — URINALYSIS, ROUTINE W REFLEX MICROSCOPIC
Bilirubin Urine: NEGATIVE
Glucose, UA: NEGATIVE mg/dL
Ketones, ur: NEGATIVE mg/dL
Nitrite: NEGATIVE
PH: 5 (ref 5.0–8.0)
Protein, ur: NEGATIVE mg/dL
SPECIFIC GRAVITY, URINE: 1.017 (ref 1.005–1.030)

## 2017-12-15 LAB — WET PREP, GENITAL
Sperm: NONE SEEN
Yeast Wet Prep HPF POC: NONE SEEN

## 2017-12-15 LAB — PREGNANCY, URINE: PREG TEST UR: NEGATIVE

## 2017-12-15 MED ORDER — METRONIDAZOLE 500 MG PO TABS
2000.0000 mg | ORAL_TABLET | Freq: Once | ORAL | Status: AC
  Administered 2017-12-15: 2000 mg via ORAL
  Filled 2017-12-15: qty 4

## 2017-12-15 MED ORDER — METRONIDAZOLE 500 MG PO TABS: 500.0000 mg | ORAL_TABLET | Freq: Two times a day (BID) | ORAL | 0 refills | Status: DC

## 2017-12-15 NOTE — Discharge Instructions (Signed)
We will contact you with the results of your remaining lab work and it is available. You will need to follow[facility]up at the [facility] for further evaluation. Return to ED for worsening symptoms, severe vaginal bleeding causing lightheadedness, abdominal pain, fever.

## 2017-12-15 NOTE — ED Provider Notes (Signed)
[facility]-EMERGENCY DEPT Provider Note   CSN: [id number] Arrival date & time: 12/15/17  1103     History   Chief Complaint Chief Complaint  Patient presents with  . Vaginal Bleeding  . vaginal odor    HPI [name] is a 37 y.o. female who presents to ED for evaluation of intermittent 36-month history of scant vaginal bleeding with wiping.  She also reports 1 month history of vaginal odor.  Patient is status post vaginal delivery on 7/24.  She is had intermittent bleeding since then.  She does not have to wear a pad but otherwise only has blood when she wipes.  Denies any significant abdominal pain.  She had a cholecystectomy done approximately 1 month ago.  She had a vaginal odor and some discharge since then.  Denies any cramping, fever, vomiting, dysuria.  She is currently on Depo for birth control.  HPI  History reviewed. No pertinent past medical history.  Patient Active Problem List   Diagnosis Date Noted  . OBESITY, NOS 05/21/2006    Past Surgical History:  Procedure Laterality Date  . APPENDECTOMY    . CHOLECYSTECTOMY       OB History   None      Home Medications    Prior to Admission medications   Medication Sig Start Date End Date Taking? Authorizing Provider  metroNIDAZOLE (FLAGYL) 500 MG tablet Take 1 tablet (500 mg total) by mouth 2 (two) times daily. 12/15/17   Dietrich Pates, PA-C    Family History Family History  Problem Relation Age of Onset  . Hypertension Mother   . High Cholesterol Mother   . Diabetes Father   . Heart failure Father     Social History Social History   Tobacco Use  . Smoking status: Never Smoker  . Smokeless tobacco: Never Used  Substance Use Topics  . Alcohol use: Yes  . Drug use: Never     Allergies   Patient has no known allergies.   Review of Systems Review of Systems  Constitutional: Negative for appetite change, chills and fever.  HENT: Negative for ear pain, rhinorrhea,  sneezing and sore throat.   Eyes: Negative for photophobia and visual disturbance.  Respiratory: Negative for cough, chest tightness, shortness of breath and wheezing.   Cardiovascular: Negative for chest pain and palpitations.  Gastrointestinal: Negative for abdominal pain, blood in stool, constipation, diarrhea, nausea and vomiting.  Genitourinary: Positive for vaginal bleeding and vaginal discharge. Negative for dysuria, hematuria, urgency and vaginal pain.  Musculoskeletal: Negative for myalgias.  Skin: Negative for rash.  Neurological: Negative for dizziness, weakness and light-headedness.     Physical Exam Updated Vital Signs BP 111/81 (BP Location: Left Arm)   Pulse 71   Temp 98.4 F (36.9 C) (Oral)   Resp 16   Ht 5\' 5"  (1.651 m)   Wt 87.1 kg   SpO2 100%   BMI 31.95 kg/m   Physical Exam  Constitutional: She appears well-developed and well-nourished. No distress.  HENT:  Head: Normocephalic and atraumatic.  Nose: Nose normal.  Eyes: Conjunctivae and EOM are normal. Right eye exhibits no discharge. Left eye exhibits no discharge. No scleral icterus.  Neck: Normal range of motion. Neck supple.  Cardiovascular: Normal rate, regular rhythm, normal heart sounds and intact distal pulses. Exam reveals no gallop and no friction rub.  No murmur heard. Pulmonary/Chest: Effort normal and breath sounds normal. No respiratory distress.  Abdominal: Soft. Bowel sounds are normal. She exhibits no  distension. There is no tenderness. There is no guarding.  Genitourinary: Uterus normal. Right adnexum displays no tenderness. Left adnexum displays no tenderness. There is bleeding in the vagina.  Genitourinary Comments: Pelvic exam: normal external genitalia without evidence of trauma. VULVA: normal appearing vulva with no masses, tenderness or lesion. VAGINA: normal appearing vagina with normal color and discharge; scant dark red blood in vaginal vault; no lesions. CERVIX: normal appearing  cervix without lesions, cervical motion tenderness absent, cervical os closed with out purulent discharge; No vaginal discharge. Wet prep and DNA probe for chlamydia and GC obtained.   ADNEXA: normal adnexa in size, nontender and no masses UTERUS: uterus is normal size, shape, consistency and nontender.    Musculoskeletal: Normal range of motion. She exhibits no edema.  Neurological: She is alert. She exhibits normal muscle tone. Coordination normal.  Skin: Skin is warm and dry. No rash noted.  Psychiatric: She has a normal mood and affect.  Nursing note and vitals reviewed.    ED Treatments / Results  Labs (all labs ordered are listed, but only abnormal results are displayed) Labs Reviewed  WET PREP, GENITAL - Abnormal; Notable for the following components:      Result Value   Trich, Wet Prep PRESENT (*)    Clue Cells Wet Prep HPF POC PRESENT (*)    WBC, Wet Prep HPF POC MANY (*)    All other components within normal limits  URINALYSIS, ROUTINE W REFLEX MICROSCOPIC - Abnormal; Notable for the following components:   Hgb urine dipstick MODERATE (*)    Leukocytes, UA TRACE (*)    Bacteria, UA FEW (*)    All other components within normal limits  PREGNANCY, URINE  GC/CHLAMYDIA PROBE AMP ([facility]) NOT AT [facility]    EKG None  Radiology [place] Pelvic Complete With Transvaginal  Result Date: 12/15/2017 CLINICAL DATA:  Vaginal bleeding for 2 months.  Irregular periods. EXAM: TRANSABDOMINAL AND TRANSVAGINAL ULTRASOUND OF PELVIS TECHNIQUE: Both transabdominal and transvaginal ultrasound examinations of the pelvis were performed. Transabdominal technique was performed for global imaging of the pelvis including uterus, ovaries, adnexal regions, and pelvic cul-de-sac. It was necessary to proceed with endovaginal exam following the transabdominal exam to visualize the endometrium. COMPARISON:  None available for comparison at time of study interpretation. FINDINGS: Uterus Measurements: 7.7 x 4.4  x 5.3 cm. No fibroids or other mass visualized. Small nabothian cyst at level of cervix. Endometrium Thickness: 5 mm.  No focal abnormality visualized. Right ovary Measurements: 3.3 x 2.3 x 1.9 cm. Normal appearance/no adnexal mass. Left ovary Measurements: 3.3 x 1.7 x 1.6 cm. Normal appearance/no adnexal mass. Other findings Trace free fluid, within normal range for physiology. IMPRESSION: Normal pelvic ultrasound. If bleeding remains unresponsive to hormonal or medical therapy, sonohysterogram should be considered for focal lesion work-up. (Ref: Radiological Reasoning: Algorithmic Workup of Abnormal Vaginal Bleeding with Endovaginal Sonography and Sonohysterography. AJR 2008; 161:W96-04; 191:S68-73) Electronically Signed   By: Awilda Metroourtnay  Bloomer M.D.   On: 12/15/2017 15:19    Procedures Procedures (including critical care time)  Medications Ordered in ED Medications  metroNIDAZOLE (FLAGYL) tablet 2,000 mg (has no administration in time range)     Initial Impression / Assessment and Plan / ED Course  I have reviewed the triage vital signs and the nursing notes.  Pertinent labs & imaging results that were available during my care of the patient were reviewed by me and considered in my medical decision making (see chart for details).     37 year old female  who is status post SVD on 7/24 and status post cholecystectomy last month presents for ongoing scant vaginal bleeding that has been intermittent since her delivery.  She also reports vaginal odor since her cholecystectomy last month.  She is not concerned about STDs.  She is not having to wear any pads to help with the bleeding.  Denies any lightheadedness or significant abdominal pain.  Pelvic exam revealed scant vaginal bleeding.  Wet prep positive for clue cells, trichomonas and WBC.  Pelvic ultrasound shows no abnormalities.  Urinalysis shows few bacteria and leukocytes.  Urine pregnancy negative.  Patient will be treated for trichomonas and prefers to  wait on results of remaining lab work including GC chlamydia when it is available.  Will encourage her to establish OB/GYN care at the [facility].  She remains hemodynamically stable.  She is agreeable to discharge home and returning to ED for any severe worsening symptoms.  Portions of this note were generated with Scientist, clinical (histocompatibility and immunogenetics). Dictation errors may occur despite best attempts at proofreading.   Final Clinical Impressions(s) / ED Diagnoses   Final diagnoses:  Vaginal bleeding  Abnormal uterine bleeding (AUB)  BV (bacterial vaginosis)  Trichomoniasis    ED Discharge Orders         Ordered    metroNIDAZOLE (FLAGYL) 500 MG tablet  2 times daily     12/15/17 1546           Dietrich Pates, PA-C 12/15/17 1548    Pricilla Loveless, MD 12/15/17 1556

## 2017-12-15 NOTE — ED Triage Notes (Signed)
Patient states she has had vaginal bleeding,but only notices it when she wipes. Patient also c/o a vaginal odor since 11/19/17.

## 2017-12-16 LAB — GC/CHLAMYDIA PROBE AMP (~~LOC~~) NOT AT ARMC
Chlamydia: NEGATIVE
NEISSERIA GONORRHEA: NEGATIVE

## 2018-11-23 DIAGNOSIS — E559 Vitamin D deficiency, unspecified: Secondary | ICD-10-CM

## 2018-11-23 HISTORY — DX: Vitamin D deficiency, unspecified: E55.9

## 2018-12-06 ENCOUNTER — Other Ambulatory Visit: Payer: Self-pay

## 2018-12-06 ENCOUNTER — Encounter: Payer: Self-pay | Admitting: Family Medicine

## 2018-12-06 ENCOUNTER — Ambulatory Visit (INDEPENDENT_AMBULATORY_CARE_PROVIDER_SITE_OTHER): Payer: Medicaid Other | Admitting: Family Medicine

## 2018-12-06 VITALS — BP 132/87 | HR 82 | Temp 98.2°F | Ht 65.0 in | Wt 195.6 lb

## 2018-12-06 DIAGNOSIS — Z23 Encounter for immunization: Secondary | ICD-10-CM | POA: Diagnosis not present

## 2018-12-06 DIAGNOSIS — Z30013 Encounter for initial prescription of injectable contraceptive: Secondary | ICD-10-CM | POA: Diagnosis not present

## 2018-12-06 DIAGNOSIS — Z3009 Encounter for other general counseling and advice on contraception: Secondary | ICD-10-CM

## 2018-12-06 DIAGNOSIS — Z8349 Family history of other endocrine, nutritional and metabolic diseases: Secondary | ICD-10-CM | POA: Diagnosis not present

## 2018-12-06 DIAGNOSIS — Z0000 Encounter for general adult medical examination without abnormal findings: Secondary | ICD-10-CM | POA: Diagnosis not present

## 2018-12-06 DIAGNOSIS — Z09 Encounter for follow-up examination after completed treatment for conditions other than malignant neoplasm: Secondary | ICD-10-CM

## 2018-12-06 DIAGNOSIS — Z3042 Encounter for surveillance of injectable contraceptive: Secondary | ICD-10-CM | POA: Diagnosis not present

## 2018-12-06 DIAGNOSIS — Z7689 Persons encountering health services in other specified circumstances: Secondary | ICD-10-CM | POA: Diagnosis not present

## 2018-12-06 HISTORY — DX: Encounter for initial prescription of injectable contraceptive: Z30.013

## 2018-12-06 LAB — POCT URINALYSIS DIPSTICK
Bilirubin, UA: NEGATIVE
Glucose, UA: NEGATIVE
Ketones, UA: NEGATIVE
Leukocytes, UA: NEGATIVE
Nitrite, UA: NEGATIVE
Protein, UA: NEGATIVE
Spec Grav, UA: 1.025 (ref 1.010–1.025)
Urobilinogen, UA: 0.2 E.U./dL
pH, UA: 6 (ref 5.0–8.0)

## 2018-12-06 MED ORDER — MEDROXYPROGESTERONE ACETATE 150 MG/ML IM SUSP: 150.0000 mg | Freq: Once | INTRAMUSCULAR | Status: DC

## 2018-12-06 NOTE — Patient Instructions (Addendum)
Exercising to Stay Healthy To become healthy and stay healthy, it is recommended that you do moderate-intensity and vigorous-intensity exercise. You can tell that you are exercising at a moderate intensity if your heart starts beating faster and you start breathing faster but can still hold a conversation. You can tell that you are exercising at a vigorous intensity if you are breathing much harder and faster and cannot hold a conversation while exercising. Exercising regularly is important. It has many health benefits, such as:  Improving overall fitness, flexibility, and endurance.  Increasing bone density.  Helping with weight control.  Decreasing body fat.  Increasing muscle strength.  Reducing stress and tension.  Improving overall health. How often should I exercise? Choose an activity that you enjoy, and set realistic goals. Your health care provider can help you make an activity plan that works for you. Exercise regularly as told by your health care provider. This may include:  Doing strength training two times a week, such as: ? Lifting weights. ? Using resistance bands. ? Push-ups. ? Sit-ups. ? Yoga.  Doing a certain intensity of exercise for a given amount of time. Choose from these options: ? A total of 150 minutes of moderate-intensity exercise every week. ? A total of 75 minutes of vigorous-intensity exercise every week. ? A mix of moderate-intensity and vigorous-intensity exercise every week. Children, pregnant women, people who have not exercised regularly, people who are overweight, and older adults may need to talk with a health care provider about what activities are safe to do. If you have a medical condition, be sure to talk with your health care provider before you start a new exercise program. What are some exercise ideas? Moderate-intensity exercise ideas include:  Walking 1 mile (1.6 km) in about 15 minutes.  Biking.  Hiking.  Golfing.  Dancing.   Water aerobics. Vigorous-intensity exercise ideas include:  Walking 4.5 miles (7.2 km) or more in about 1 hour.  Jogging or running 5 miles (8 km) in about 1 hour.  Biking 10 miles (16.1 km) or more in about 1 hour.  Lap swimming.  Roller-skating or in-line skating.  Cross-country skiing.  Vigorous competitive sports, such as football, basketball, and soccer.  Jumping rope.  Aerobic dancing. What are some everyday activities that can help me to get exercise?  Yard work, such as: ? Pushing a lawn mower. ? Raking and bagging leaves.  Washing your car.  Pushing a stroller.  Shoveling snow.  Gardening.  Washing windows or floors. How can I be more active in my day-to-day activities?  Use stairs instead of an elevator.  Take a walk during your lunch break.  If you drive, park your car farther away from your work or school.  If you take public transportation, get off one stop early and walk the rest of the way.  Stand up or walk around during all of your indoor phone calls.  Get up, stretch, and walk around every 30 minutes throughout the day.  Enjoy exercise with a friend. Support to continue exercising will help you keep a regular routine of activity. What guidelines can I follow while exercising?  Before you start a new exercise program, talk with your health care provider.  Do not exercise so much that you hurt yourself, feel dizzy, or get very short of breath.  Wear comfortable clothes and wear shoes with good support.  Drink plenty of water while you exercise to prevent dehydration or heat stroke.  Work out until your breathing   and your heartbeat get faster. Where to find more information  [place] Department of Health and Human Services: ThisPath.fiwww.hhs.gov  Centers for Disease Control and Prevention (CDC): FootballExhibition.com.brwww.cdc.gov Summary  Exercising regularly is important. It will improve your overall fitness, flexibility, and endurance.  Regular exercise also will  improve your overall health. It can help you control your weight, reduce stress, and improve your bone density.  Do not exercise so much that you hurt yourself, feel dizzy, or get very short of breath.  Before you start a new exercise program, talk with your health care provider. This information is not intended to replace advice given to you by your health care provider. Make sure you discuss any questions you have with your health care provider. Document Released: 04/12/2010 Document Revised: 02/20/2017 Document Reviewed: 01/29/2017 Elsevier Patient Education  2020 [employer] DASH Eating Plan DASH stands for "Dietary Approaches to Stop Hypertension." The DASH eating plan is a healthy eating plan that has been shown to reduce high blood pressure (hypertension). It may also reduce your risk for type 2 diabetes, heart disease, and stroke. The DASH eating plan may also help with weight loss. What are tips for following this plan?  General guidelines  Avoid eating more than 2,300 mg (milligrams) of salt (sodium) a day. If you have hypertension, you may need to reduce your sodium intake to 1,500 mg a day.  Limit alcohol intake to no more than 1 drink a day for nonpregnant women and 2 drinks a day for men. One drink equals 12 oz of beer, 5 oz of wine, or 1 oz of hard liquor.  Work with your health care provider to maintain a healthy body weight or to lose weight. Ask what an ideal weight is for you.  Get at least 30 minutes of exercise that causes your heart to beat faster (aerobic exercise) most days of the week. Activities may include walking, swimming, or biking.  Work with your health care provider or diet and nutrition specialist (dietitian) to adjust your eating plan to your individual calorie needs. Reading food labels   Check food labels for the amount of sodium per serving. Choose foods with less than 5 percent of the Daily Value of sodium. Generally, foods with less than 300 mg of  sodium per serving fit into this eating plan.  To find whole grains, look for the word "whole" as the first word in the ingredient list. Shopping  Buy products labeled as "low-sodium" or "no salt added."  Buy fresh foods. Avoid canned foods and premade or frozen meals. Cooking  Avoid adding salt when cooking. Use salt-free seasonings or herbs instead of table salt or sea salt. Check with your health care provider or pharmacist before using salt substitutes.  Do not fry foods. Cook foods using healthy methods such as baking, boiling, grilling, and broiling instead.  Cook with heart-healthy oils, such as olive, canola, soybean, or sunflower oil. Meal planning  Eat a balanced diet that includes: ? 5 or more servings of fruits and vegetables each day. At each meal, try to fill half of your plate with fruits and vegetables. ? Up to 6-8 servings of whole grains each day. ? Less than 6 oz of lean meat, poultry, or fish each day. A 3-oz serving of meat is about the same size as a deck of cards. One egg equals 1 oz. ? 2 servings of low-fat dairy each day. ? A serving of nuts, seeds, or beans 5 times each week. ?  Heart-healthy fats. Healthy fats called Omega-3 fatty acids are found in foods such as flaxseeds and coldwater fish, like sardines, salmon, and mackerel.  Limit how much you eat of the following: ? Canned or prepackaged foods. ? Food that is high in trans fat, such as fried foods. ? Food that is high in saturated fat, such as fatty meat. ? Sweets, desserts, sugary drinks, and other foods with added sugar. ? Full-fat dairy products.  Do not salt foods before eating.  Try to eat at least 2 vegetarian meals each week.  Eat more home-cooked food and less restaurant, buffet, and fast food.  When eating at a restaurant, ask that your food be prepared with less salt or no salt, if possible. What foods are recommended? The items listed may not be a complete list. Talk with your  dietitian about what dietary choices are best for you. Grains Whole-grain or whole-wheat bread. Whole-grain or whole-wheat pasta. Brown rice. Orpah Cobb. Bulgur. Whole-grain and low-sodium cereals. Pita bread. Low-fat, low-sodium crackers. Whole-wheat flour tortillas. Vegetables Fresh or frozen vegetables (raw, steamed, roasted, or grilled). Low-sodium or reduced-sodium tomato and vegetable juice. Low-sodium or reduced-sodium tomato sauce and tomato paste. Low-sodium or reduced-sodium canned vegetables. Fruits All fresh, dried, or frozen fruit. Canned fruit in natural juice (without added sugar). Meat and other protein foods Skinless chicken or [place]. Ground chicken or [place]. Pork with fat trimmed off. Fish and seafood. Egg whites. Dried beans, peas, or lentils. Unsalted nuts, nut butters, and seeds. Unsalted canned beans. Lean cuts of beef with fat trimmed off. Low-sodium, lean deli meat. Dairy Low-fat (1%) or fat-free (skim) milk. Fat-free, low-fat, or reduced-fat cheeses. Nonfat, low-sodium ricotta or cottage cheese. Low-fat or nonfat yogurt. Low-fat, low-sodium cheese. Fats and oils Soft margarine without trans fats. Vegetable oil. Low-fat, reduced-fat, or light mayonnaise and salad dressings (reduced-sodium). Canola, safflower, olive, soybean, and sunflower oils. Avocado. Seasoning and other foods Herbs. Spices. Seasoning mixes without salt. Unsalted popcorn and pretzels. Fat-free sweets. What foods are not recommended? The items listed may not be a complete list. Talk with your dietitian about what dietary choices are best for you. Grains Baked goods made with fat, such as croissants, muffins, or some breads. Dry pasta or rice meal packs. Vegetables Creamed or fried vegetables. Vegetables in a cheese sauce. Regular canned vegetables (not low-sodium or reduced-sodium). Regular canned tomato sauce and paste (not low-sodium or reduced-sodium). Regular tomato and vegetable juice (not  low-sodium or reduced-sodium). Rosita Fire. Olives. Fruits Canned fruit in a light or heavy syrup. Fried fruit. Fruit in cream or butter sauce. Meat and other protein foods Fatty cuts of meat. Ribs. Fried meat. Tomasa Blase. Sausage. Bologna and other processed lunch meats. Salami. Fatback. Hotdogs. Bratwurst. Salted nuts and seeds. Canned beans with added salt. Canned or smoked fish. Whole eggs or egg yolks. Chicken or [place] with skin. Dairy Whole or 2% milk, cream, and half-and-half. Whole or full-fat cream cheese. Whole-fat or sweetened yogurt. Full-fat cheese. Nondairy creamers. Whipped toppings. Processed cheese and cheese spreads. Fats and oils Butter. Stick margarine. Lard. Shortening. Ghee. Bacon fat. Tropical oils, such as coconut, palm kernel, or palm oil. Seasoning and other foods Salted popcorn and pretzels. Onion salt, garlic salt, seasoned salt, table salt, and sea salt. Worcestershire sauce. Tartar sauce. Barbecue sauce. Teriyaki sauce. Soy sauce, including reduced-sodium. Steak sauce. Canned and packaged gravies. Fish sauce. Oyster sauce. Cocktail sauce. Horseradish that you find on the shelf. Ketchup. Mustard. Meat flavorings and tenderizers. Bouillon cubes. Hot sauce and Tabasco sauce.  Premade or packaged marinades. Premade or packaged taco seasonings. Relishes. Regular salad dressings. Where to find more information:  National Heart, Lung, and Blood Institute: PopSteam.iswww.nhlbi.nih.gov  American Heart Association: www.heart.org Summary  The DASH eating plan is a healthy eating plan that has been shown to reduce high blood pressure (hypertension). It may also reduce your risk for type 2 diabetes, heart disease, and stroke.  With the DASH eating plan, you should limit salt (sodium) intake to 2,300 mg a day. If you have hypertension, you may need to reduce your sodium intake to 1,500 mg a day.  When on the DASH eating plan, aim to eat more fresh fruits and vegetables, whole grains, lean proteins,  low-fat dairy, and heart-healthy fats.  Work with your health care provider or diet and nutrition specialist (dietitian) to adjust your eating plan to your individual calorie needs. This information is not intended to replace advice given to you by your health care provider. Make sure you discuss any questions you have with your health care provider. Document Released: 02/27/2011 Document Revised: 02/20/2017 Document Reviewed: 03/03/2016 Elsevier Patient Education  2020 [employer]  Medroxyprogesterone injection [Contraceptive] What is this medicine? MEDROXYPROGESTERONE (me DROX ee proe JES te rone) contraceptive injections prevent pregnancy. They provide effective birth control for 3 months. Depo-subQ Provera 104 is also used for treating pain related to endometriosis. This medicine may be used for other purposes; ask your health care provider or pharmacist if you have questions. COMMON BRAND NAME(S): Depo-Provera, Depo-subQ Provera 104 What should I tell my health care provider before I take this medicine? They need to know if you have any of these conditions:  frequently drink alcohol  asthma  blood vessel disease or a history of a blood clot in the lungs or legs  bone disease such as osteoporosis  breast cancer  diabetes  eating disorder (anorexia nervosa or bulimia)  high blood pressure  HIV infection or AIDS  kidney disease  liver disease  mental depression  migraine  seizures (convulsions)  stroke  tobacco smoker  vaginal bleeding  an unusual or allergic reaction to medroxyprogesterone, other hormones, medicines, foods, dyes, or preservatives  pregnant or trying to get pregnant  breast-feeding How should I use this medicine? Depo-Provera Contraceptive injection is given into a muscle. Depo-subQ Provera 104 injection is given under the skin. These injections are given by a health care professional. You must not be pregnant before getting an injection.  The injection is usually given during the first 5 days after the start of a menstrual period or 6 weeks after delivery of a baby. Talk to your pediatrician regarding the use of this medicine in children. Special care may be needed. These injections have been used in female children who have started having menstrual periods. Overdosage: If you think you have taken too much of this medicine contact a poison control center or emergency room at once. NOTE: This medicine is only for you. Do not share this medicine with others. What if I miss a dose? Try not to miss a dose. You must get an injection once every 3 months to maintain birth control. If you cannot keep an appointment, call and reschedule it. If you wait longer than 13 weeks between Depo-Provera contraceptive injections or longer than 14 weeks between Depo-subQ Provera 104 injections, you could get pregnant. Use another method for birth control if you miss your appointment. You may also need a pregnancy test before receiving another injection. What may interact with this medicine?  Do not take this medicine with any of the following medications:  bosentan This medicine may also interact with the following medications:  aminoglutethimide  antibiotics or medicines for infections, especially rifampin, rifabutin, rifapentine, and griseofulvin  aprepitant  barbiturate medicines such as phenobarbital or primidone  bexarotene  carbamazepine  medicines for seizures like ethotoin, felbamate, oxcarbazepine, phenytoin, topiramate  modafinil  St. John's wort This list may not describe all possible interactions. Give your health care provider a list of all the medicines, herbs, non-prescription drugs, or dietary supplements you use. Also tell them if you smoke, drink alcohol, or use illegal drugs. Some items may interact with your medicine. What should I watch for while using this medicine? This drug does not protect you against HIV infection  (AIDS) or other sexually transmitted diseases. Use of this product may cause you to lose calcium from your bones. Loss of calcium may cause weak bones (osteoporosis). Only use this product for more than 2 years if other forms of birth control are not right for you. The longer you use this product for birth control the more likely you will be at risk for weak bones. Ask your health care professional how you can keep strong bones. You may have a change in bleeding pattern or irregular periods. Many females stop having periods while taking this drug. If you have received your injections on time, your chance of being pregnant is very low. If you think you may be pregnant, see your health care professional as soon as possible. Tell your health care professional if you want to get pregnant within the next year. The effect of this medicine may last a long time after you get your last injection. What side effects may I notice from receiving this medicine? Side effects that you should report to your doctor or health care professional as soon as possible:  allergic reactions like skin rash, itching or hives, swelling of the face, lips, or tongue  breast tenderness or discharge  breathing problems  changes in vision  depression  feeling faint or lightheaded, falls  fever  pain in the abdomen, chest, groin, or leg  problems with balance, talking, walking  unusually weak or tired  yellowing of the eyes or skin Side effects that usually do not require medical attention (report to your doctor or health care professional if they continue or are bothersome):  acne  fluid retention and swelling  headache  irregular periods, spotting, or absent periods  temporary pain, itching, or skin reaction at site where injected  weight gain This list may not describe all possible side effects. Call your doctor for medical advice about side effects. You may report side effects to FDA at 1-800-FDA-1088.  Where should I keep my medicine? This does not apply. The injection will be given to you by a health care professional. NOTE: This sheet is a summary. It may not cover all possible information. If you have questions about this medicine, talk to your doctor, pharmacist, or health care provider.  2020 Elsevier/Gold Standard (2008-03-31 18:37:56)

## 2018-12-06 NOTE — Progress Notes (Signed)
[facility]  New Patient--Establish Care  Subjective:  Patient ID: [name], female    DOB: 1980-05-20  Age: 38 y.o. MRN: [id number]  CC:  Chief Complaint  Patient presents with  . New Patient (Initial Visit)    establish care and dicuss birth control medication     HPI [name] is a 38 year female who presents to Establish Care today.   Past Medical History:  Diagnosis Date  . Appendicitis 2003  . Family history of thyroid problem   . Fracture of right ankle 1998-1999  . History of cholecystectomy 10/2017  . Initiation of Depo Provera 12/06/2018    Current Status: This will be her initial office visit with me. She was previously seeing a physician in [place] for her PCP needs. She now has been in [place] for 1 year or so. Since her last office visit, she is doing well with no complaints.  Her anxiety is stable today. She denies suicidal ideations, homicidal ideations, or auditory hallucinations. She is requesting Depo Provera for birth control today. Menstrual periods are normal. She currently has 2 children, her son is 19 years old, and her daughter is 35 year old.   She denies fevers, chills, fatigue, recent infections, weight loss, and night sweats. She has not had any visual changes, and falls. No chest pain, heart palpitations, cough and shortness of breath reported. No reports of GI problems such as vomiting, and diarrhea. She has no reports of blood in stools, dysuria and hematuria. She denies pain today.   Past Surgical History:  Procedure Laterality Date  . APPENDECTOMY    . CHOLECYSTECTOMY      Family History  Problem Relation Age of Onset  . Hypertension Mother   . High Cholesterol Mother   . Diabetes Father   . Heart failure Father   . Thyroid disease Sister   . Diabetes Maternal Grandmother     Social History   Socioeconomic History  . Marital status: Single    Spouse name: Not on file  . Number  of children: Not on file  . Years of education: Not on file  . Highest education level: Not on file  Occupational History  . Not on file  Social Needs  . Financial resource strain: Not on file  . Food insecurity    Worry: Not on file    Inability: Not on file  . Transportation needs    Medical: Not on file    Non-medical: Not on file  Tobacco Use  . Smoking status: Current Every Day Smoker    Types: Cigarettes  . Smokeless tobacco: Never Used  Substance and Sexual Activity  . Alcohol use: Yes  . Drug use: Never  . Sexual activity: Not on file  Lifestyle  . Physical activity    Days per week: Not on file    Minutes per session: Not on file  . Stress: Not on file  Relationships  . Social Herbalist on phone: Not on file    Gets together: Not on file    Attends religious service: Not on file    Active member of club or organization: Not on file    Attends meetings of clubs or organizations: Not on file    Relationship status: Not on file  . Intimate partner violence    Fear of current or ex partner: Not on file    Emotionally abused: Not on file  Physically abused: Not on file    Forced sexual activity: Not on file  Other Topics Concern  . Not on file  Social History Narrative  . Not on file    Outpatient Medications Prior to Visit  Medication Sig Dispense Refill  . metroNIDAZOLE (FLAGYL) 500 MG tablet Take 1 tablet (500 mg total) by mouth 2 (two) times daily. (Patient not taking: Reported on 12/06/2018) 14 tablet 0   No facility-administered medications prior to visit.     No Known Allergies  ROS Review of Systems  Constitutional: Negative.   HENT: Negative.   Eyes: Negative.   Respiratory: Negative.   Cardiovascular: Negative.   Gastrointestinal: Negative.   Endocrine: Negative.   Genitourinary: Negative.   Musculoskeletal: Negative.   Skin: Negative.   Allergic/Immunologic: Negative.   Neurological: Negative.   Hematological: Negative.    Psychiatric/Behavioral: Negative.       Objective:    Physical Exam  Constitutional: She is oriented to person, place, and time. She appears well-developed and well-nourished.  HENT:  Head: Normocephalic and atraumatic.  Eyes: Conjunctivae are normal.  Neck: Normal range of motion. Neck supple.  Cardiovascular: Normal rate, regular rhythm, normal heart sounds and intact distal pulses.  Pulmonary/Chest: Effort normal and breath sounds normal.  Abdominal: Soft. Bowel sounds are normal.  Musculoskeletal: Normal range of motion.  Neurological: She is alert and oriented to person, place, and time. She has normal reflexes.  Skin: Skin is warm and dry.  Psychiatric: She has a normal mood and affect. Her behavior is normal. Judgment and thought content normal.  Nursing note and vitals reviewed.   BP 132/87 (BP Location: Left Arm, Patient Position: Sitting, Cuff Size: Normal)   Pulse 82   Temp 98.2 F (36.8 C) (Oral)   Ht 5\' 5"  (1.651 m)   Wt 195 lb 9.6 oz (88.7 kg)   LMP 11/16/2018   SpO2 100%   BMI 32.55 kg/m  Wt Readings from Last 3 Encounters:  12/06/18 195 lb 9.6 oz (88.7 kg)  12/15/17 192 lb (87.1 kg)     Health Maintenance Due  Topic Date Due  . HIV Screening  03/27/1997  . TETANUS/TDAP  03/27/2001  . PAP SMEAR-Modifier  10/22/2005  . INFLUENZA VACCINE  10/23/2018    There are no preventive care reminders to display for this patient.  No results found for: TSH Lab Results  Component Value Date   WBC 4.5 08/12/2008   HGB 12.2 04/21/2009   HCT 36.0 04/21/2009   MCV 73.9 (L) 08/12/2008   PLT 239 08/12/2008   Lab Results  Component Value Date   NA 140 04/21/2009   K 3.6 04/21/2009   CO2 25 08/12/2008   GLUCOSE 90 04/21/2009   BUN 6 04/21/2009   CREATININE 0.8 04/21/2009   AST 17 06/26/2008   CALCIUM 9.2 08/12/2008   No results found for: CHOL No results found for: HDL No results found for: LDLCALC No results found for: TRIG No results found for:  CHOLHDL No results found for: [id number]    Assessment & Plan:   1. Encounter to establish care  2. Encounter for Depo-Provera contraception - medroxyPROGESTERone (DEPO-PROVERA) injection 150 mg  3. Initiation of Depo Provera We will initiate Depo Provera contraception injection today.   4. Counseling for birth control, oral contraceptives She received Depo Provera injection in office today.  - POCT urine pregnancy - mdroxyPROGESTERone (DEPO-PROVERA) injection 150 mg  5. Family history of thyroid problem - TSH  6. Need for  immunization against influenza - Flu Vaccine QUAD 6+ mos PF IM (Fluarix Quad PF)  7. Healthcare maintenance - POCT Urinalysis Dipstick - CBC with Differential - Comprehensive metabolic panel - TSH - Lipid Panel - Vitamin B12 - Vitamin D, 25-hydroxy  8. Follow up She will follow up in 1 month.   Meds ordered this encounter  Medications  . medroxyPROGESTERone (DEPO-PROVERA) injection 150 mg    Orders Placed This Encounter  Procedures  . Flu Vaccine QUAD 6+ mos PF IM (Fluarix Quad PF)  . CBC with Differential  . Comprehensive metabolic panel  . TSH  . Lipid Panel  . Vitamin B12  . Vitamin D, 25-hydroxy  . POCT Urinalysis Dipstick  . POCT urine pregnancy    Referral Orders  No referral(s) requested today    Raliegh Ip,  MSN, FNP-BC [facility] [address] [place], [place] [address] [phone]- fax  Problem List Items Addressed This Visit    None    Visit Diagnoses    Encounter to establish care    -  Primary   Encounter for Depo-Provera contraception       Relevant Medications   medroxyPROGESTERone (DEPO-PROVERA) injection 150 mg (Start on 12/06/2018 10:00 AM)   Initiation of Depo Provera       Counseling for birth control, oral contraceptives       Relevant Medications   medroxyPROGESTERone (DEPO-PROVERA) injection 150 mg (Start on 12/06/2018  10:00 AM)   Other Relevant Orders   POCT urine pregnancy   Family history of thyroid problem       Need for immunization against influenza       Relevant Orders   Flu Vaccine QUAD 6+ mos PF IM (Fluarix Quad PF)   Healthcare maintenance       Relevant Orders   POCT Urinalysis Dipstick (Completed)   CBC with Differential   Comprehensive metabolic panel   TSH   Lipid Panel   Vitamin B12   Vitamin D, 25-hydroxy   Follow up          Meds ordered this encounter  Medications  . medroxyPROGESTERone (DEPO-PROVERA) injection 150 mg    Follow-up: Return in about 1 month (around 01/05/2019).    Kallie Locks, FNP

## 2018-12-07 ENCOUNTER — Encounter: Payer: Self-pay | Admitting: Family Medicine

## 2018-12-07 DIAGNOSIS — Z3042 Encounter for surveillance of injectable contraceptive: Secondary | ICD-10-CM | POA: Insufficient documentation

## 2018-12-07 LAB — CBC WITH DIFFERENTIAL/PLATELET
Basophils Absolute: 0 10*3/uL (ref 0.0–0.2)
Basos: 1 %
EOS (ABSOLUTE): 0.1 10*3/uL (ref 0.0–0.4)
Eos: 2 %
Hematocrit: 43.2 % (ref 34.0–46.6)
Hemoglobin: 13.2 g/dL (ref 11.1–15.9)
Immature Grans (Abs): 0 10*3/uL (ref 0.0–0.1)
Immature Granulocytes: 0 %
Lymphocytes Absolute: 1.2 10*3/uL (ref 0.7–3.1)
Lymphs: 35 %
MCH: 23 pg — ABNORMAL LOW (ref 26.6–33.0)
MCHC: 30.6 g/dL — ABNORMAL LOW (ref 31.5–35.7)
MCV: 75 fL — ABNORMAL LOW (ref 79–97)
Monocytes Absolute: 0.5 10*3/uL (ref 0.1–0.9)
Monocytes: 15 %
Neutrophils Absolute: 1.6 10*3/uL (ref 1.4–7.0)
Neutrophils: 47 %
Platelets: 334 10*3/uL (ref 150–450)
RBC: 5.74 x10E6/uL — ABNORMAL HIGH (ref 3.77–5.28)
RDW: 16.6 % — ABNORMAL HIGH (ref 11.7–15.4)
WBC: 3.4 10*3/uL (ref 3.4–10.8)

## 2018-12-07 LAB — VITAMIN B12: Vitamin B-12: 416 pg/mL (ref 232–1245)

## 2018-12-07 LAB — COMPREHENSIVE METABOLIC PANEL
ALT: 25 IU/L (ref 0–32)
AST: 29 IU/L (ref 0–40)
Albumin/Globulin Ratio: 1.7 (ref 1.2–2.2)
Albumin: 4.7 g/dL (ref 3.8–4.8)
Alkaline Phosphatase: 70 IU/L (ref 39–117)
BUN/Creatinine Ratio: 14 (ref 9–23)
BUN: 13 mg/dL (ref 6–20)
Bilirubin Total: 0.3 mg/dL (ref 0.0–1.2)
CO2: 21 mmol/L (ref 20–29)
Calcium: 10 mg/dL (ref 8.7–10.2)
Chloride: 99 mmol/L (ref 96–106)
Creatinine, Ser: 0.96 mg/dL (ref 0.57–1.00)
GFR calc Af Amer: 88 mL/min/{1.73_m2} (ref >59)
GFR calc non Af Amer: 76 mL/min/{1.73_m2} (ref >59)
Globulin, Total: 2.8 g/dL (ref 1.5–4.5)
Glucose: 87 mg/dL (ref 65–99)
Potassium: 4.8 mmol/L (ref 3.5–5.2)
Sodium: 135 mmol/L (ref 134–144)
Total Protein: 7.5 g/dL (ref 6.0–8.5)

## 2018-12-07 LAB — VITAMIN D 25 HYDROXY (VIT D DEFICIENCY, FRACTURES): Vit D, 25-Hydroxy: 19.6 ng/mL — ABNORMAL LOW (ref 30.0–100.0)

## 2018-12-07 LAB — LIPID PANEL
Chol/HDL Ratio: 3.4 ratio (ref 0.0–4.4)
Cholesterol, Total: 205 mg/dL — ABNORMAL HIGH (ref 100–199)
HDL: 60 mg/dL (ref >39)
LDL Chol Calc (NIH): 128 mg/dL — ABNORMAL HIGH (ref 0–99)
Triglycerides: 94 mg/dL (ref 0–149)
VLDL Cholesterol Cal: 17 mg/dL (ref 5–40)

## 2018-12-07 LAB — TSH: TSH: 3.76 u[IU]/mL (ref 0.450–4.500)

## 2018-12-09 ENCOUNTER — Encounter: Payer: Self-pay | Admitting: Family Medicine

## 2018-12-09 ENCOUNTER — Other Ambulatory Visit: Payer: Self-pay | Admitting: Family Medicine

## 2018-12-09 DIAGNOSIS — E559 Vitamin D deficiency, unspecified: Secondary | ICD-10-CM

## 2018-12-09 MED ORDER — VITAMIN D (ERGOCALCIFEROL) 1.25 MG (50000 UNIT) PO CAPS: 50000.0000 [IU] | ORAL_CAPSULE | ORAL | 6 refills | Status: DC

## 2019-01-05 ENCOUNTER — Encounter: Payer: Self-pay | Admitting: Family Medicine

## 2019-01-05 ENCOUNTER — Ambulatory Visit (INDEPENDENT_AMBULATORY_CARE_PROVIDER_SITE_OTHER): Payer: Medicaid Other | Admitting: Family Medicine

## 2019-01-05 ENCOUNTER — Other Ambulatory Visit: Payer: Self-pay

## 2019-01-05 VITALS — BP 117/91 | HR 72 | Temp 98.5°F | Resp 14 | Ht 65.0 in | Wt 197.0 lb

## 2019-01-05 DIAGNOSIS — Z01419 Encounter for gynecological examination (general) (routine) without abnormal findings: Secondary | ICD-10-CM | POA: Diagnosis not present

## 2019-01-05 DIAGNOSIS — Z124 Encounter for screening for malignant neoplasm of cervix: Secondary | ICD-10-CM

## 2019-01-05 DIAGNOSIS — Z113 Encounter for screening for infections with a predominantly sexual mode of transmission: Secondary | ICD-10-CM

## 2019-01-05 DIAGNOSIS — Z1272 Encounter for screening for malignant neoplasm of vagina: Secondary | ICD-10-CM

## 2019-01-05 DIAGNOSIS — Z09 Encounter for follow-up examination after completed treatment for conditions other than malignant neoplasm: Secondary | ICD-10-CM

## 2019-01-05 HISTORY — DX: Encounter for screening for malignant neoplasm of cervix: Z12.4

## 2019-01-05 NOTE — Progress Notes (Signed)
[facility]    Established Patient Office Visit  Subjective:  Patient ID: [name], female    DOB: October 04, 1980  Age: 38 y.o. MRN: [id number]  CC:  Chief Complaint  Patient presents with  . Gynecologic Exam  . Exposure to STD    wants to be checked for all stds    HPI [name] is a 38 year old female who presents for Pap Smear today.   Past Medical History:  Diagnosis Date  . Appendicitis 2003  . Family history of thyroid problem   . Fracture of right ankle 1998-1999  . History of cholecystectomy 10/2017  . Initiation of Depo Provera 12/06/2018  . Vitamin D deficiency 11/2018   Gynecological Exam  Patient here for routine gynecological exam. She has previously had one Pap smear.  Patient states that she has had a history of an abnormal Pap smear. She has 2 children, ages 58 (son), 1 (daugher).  She is sexually active. She denies abnormal vaginal discharge, vaginal  itching, vaginal burning, or dyspareunia.  Patient states that she does not perform monthly self breast exams. She does have any family history of cancer but unsure of what cancer.  She typically follows a balanced diet but does not exercise routinely. Body mass index is 32.78.  Gynecologic History Patient's last menstrual period was 10/2018. Regular periods usually last 5-7 days. No discharge, discomfort, irregular bleeding, abdominal pain. She is sexually active, and she is currently on Depro-Provera, which she received her last injection 11/2018.    Obstetric History        OB History  Gravida Para Term Preterm AB Living  1 0 1 1 0 0  SAB TAB Ectopic Multiple Live Births   0 0 0 0 0     Current Status Since her last office visit, she is doing well with no complaints. She denies fevers, chills, fatigue, recent infections, weight loss, and night sweats. She has not had any headaches, visual changes, dizziness, and falls. No chest pain, heart  palpitations, cough and shortness of breath reported. No reports of GI problems such as nausea, vomiting, diarrhea, and constipation. She has no reports of blood in stools, dysuria and hematuria. No depression or anxiety reported today. She denies pain today.   Past Surgical History:  Procedure Laterality Date  . APPENDECTOMY    . CHOLECYSTECTOMY      Family History  Problem Relation Age of Onset  . Hypertension Mother   . High Cholesterol Mother   . Diabetes Father   . Heart failure Father   . Thyroid disease Sister   . Diabetes Maternal Grandmother     Social History   Socioeconomic History  . Marital status: Single    Spouse name: Not on file  . Number of children: Not on file  . Years of education: Not on file  . Highest education level: Not on file  Occupational History  . Not on file  Social Needs  . Financial resource strain: Not on file  . Food insecurity    Worry: Not on file    Inability: Not on file  . Transportation needs    Medical: Not on file    Non-medical: Not on file  Tobacco Use  . Smoking status: Current Every Day Smoker    Types: Cigarettes  . Smokeless tobacco: Never Used  Substance and Sexual Activity  . Alcohol use: Yes  . Drug use: Never  .  Sexual activity: Not on file  Lifestyle  . Physical activity    Days per week: Not on file    Minutes per session: Not on file  . Stress: Not on file  Relationships  . Social Musicianconnections    Talks on phone: Not on file    Gets together: Not on file    Attends religious service: Not on file    Active member of club or organization: Not on file    Attends meetings of clubs or organizations: Not on file    Relationship status: Not on file  . Intimate partner violence    Fear of current or ex partner: Not on file    Emotionally abused: Not on file    Physically abused: Not on file    Forced sexual activity: Not on file  Other Topics Concern  . Not on file  Social History Narrative  . Not on file     Outpatient Medications Prior to Visit  Medication Sig Dispense Refill  . Vitamin D, Ergocalciferol, (DRISDOL) 1.25 MG (50000 UT) CAPS capsule Take 1 capsule (50,000 Units total) by mouth every 7 (seven) days. (Patient not taking: Reported on 01/05/2019) 5 capsule 6   Facility-Administered Medications Prior to Visit  Medication Dose Route Frequency Provider Last Rate Last Dose  . medroxyPROGESTERone (DEPO-PROVERA) injection 150 mg  150 mg Intramuscular Once Kallie LocksStroud, Iva Montelongo M, FNP        No Known Allergies  ROS Review of Systems  Constitutional: Negative.   HENT: Negative.   Eyes: Negative.   Respiratory: Negative.   Cardiovascular: Negative.   Gastrointestinal: Negative.   Endocrine: Negative.   Genitourinary: Negative.   Musculoskeletal: Negative.   Skin: Negative.   Allergic/Immunologic: Negative.   Neurological: Negative.   Hematological: Negative.   Psychiatric/Behavioral: Negative.       Objective:    Physical Exam  Constitutional: She is oriented to person, place, and time. She appears well-developed and well-nourished.  HENT:  Head: Normocephalic and atraumatic.  Eyes: Conjunctivae are normal.  Neck: Normal range of motion. Neck supple.  Cardiovascular: Normal rate, regular rhythm, normal heart sounds and intact distal pulses.  Pulmonary/Chest: Effort normal and breath sounds normal.  Abdominal: Soft. Bowel sounds are normal.  Genitourinary:    Vagina normal.   Musculoskeletal: Normal range of motion.  Neurological: She is alert and oriented to person, place, and time. She has normal reflexes.  Skin: Skin is warm and dry.  Psychiatric: She has a normal mood and affect. Her behavior is normal. Judgment and thought content normal.  Nursing note and vitals reviewed.   BP (!) 117/91 (BP Location: Left Arm, Patient Position: Sitting, Cuff Size: Normal)   Pulse 72   Temp 98.5 F (36.9 C) (Oral)   Resp 14   Ht 5\' 5"  (1.651 m)   Wt 197 lb (89.4 kg)   SpO2  100%   BMI 32.78 kg/m  Wt Readings from Last 3 Encounters:  01/05/19 197 lb (89.4 kg)  12/06/18 195 lb 9.6 oz (88.7 kg)  12/15/17 192 lb (87.1 kg)     Health Maintenance Due  Topic Date Due  . HIV Screening  03/27/1997  . TETANUS/TDAP  03/27/2001  . PAP SMEAR-Modifier  10/22/2005    There are no preventive care reminders to display for this patient.  Lab Results  Component Value Date   TSH 3.760 12/06/2018   Lab Results  Component Value Date   WBC 3.4 12/06/2018   HGB 13.2 12/06/2018  HCT 43.2 12/06/2018   MCV 75 (L) 12/06/2018   PLT 334 12/06/2018   Lab Results  Component Value Date   NA 135 12/06/2018   K 4.8 12/06/2018   CO2 21 12/06/2018   GLUCOSE 87 12/06/2018   BUN 13 12/06/2018   CREATININE 0.96 12/06/2018   BILITOT 0.3 12/06/2018   ALKPHOS 70 12/06/2018   AST 29 12/06/2018   ALT 25 12/06/2018   PROT 7.5 12/06/2018   ALBUMIN 4.7 12/06/2018   CALCIUM 10.0 12/06/2018   Lab Results  Component Value Date   CHOL 205 (H) 12/06/2018   Lab Results  Component Value Date   HDL 60 12/06/2018   Lab Results  Component Value Date   LDLCALC 128 (H) 12/06/2018   Lab Results  Component Value Date   TRIG 94 12/06/2018   Lab Results  Component Value Date   CHOLHDL 3.4 12/06/2018   No results found for: HGBA1C    Assessment & Plan:   1. Encounter for Papanicolaou smear of vagina as part of routine gynecological examination No abnormalities noted upon GYN exam today.  Follow-up for scheduled mammogram at age 43 years old.  Recommend monthly self breast exam Recommend daily multivitamin for women Recommend strength training in 150 minutes of cardiovascular exercise per week.  2. Pap smear for cervical cancer screening  3. Screening for cervical cancer - Pap IG w/ reflex to HPV when ASC-U ([facility])  4. Screening for STD (sexually transmitted disease) - RPR - HepB+HepC+HIV Panel - HSV(herpes smplx)abs-1+2(IgG+IgM)-bld  5. Follow up She  will follow up in 3 months for Depo-Provera contraception injection. She will follow up in 6 months for office visit.   No orders of the defined types were placed in this encounter.   Orders Placed This Encounter  Procedures  . RPR  . HepB+HepC+HIV Panel  . HSV(herpes smplx)abs-1+2(IgG+IgM)-bld    Referral Orders  No referral(s) requested today    Kathe Becton,  MSN, FNP-BC [place], [place] [address] [phone]- fax  Problem List Items Addressed This Visit    None    Visit Diagnoses    Encounter for Papanicolaou smear of vagina as part of routine gynecological examination    -  Primary   Pap smear for cervical cancer screening       Screening for cervical cancer       Relevant Orders   Pap IG w/ reflex to HPV when ASC-U ([facility])   Screening for STD (sexually transmitted disease)       Relevant Orders   RPR   HepB+HepC+HIV Panel   HSV(herpes smplx)abs-1+2(IgG+IgM)-bld   Follow up          No orders of the defined types were placed in this encounter.   Follow-up: No follow-ups on file.    Azzie Glatter, FNP

## 2019-01-07 LAB — HEPB+HEPC+HIV PANEL
HIV Screen 4th Generation wRfx: NONREACTIVE
Hep B C IgM: NEGATIVE
Hep B Core Total Ab: NEGATIVE
Hep B E Ab: NEGATIVE
Hep B E Ag: NEGATIVE
Hep B Surface Ab, Qual: REACTIVE
Hep C Virus Ab: <0.1 s/co ratio (ref 0.0–0.9)
Hepatitis B Surface Ag: NEGATIVE

## 2019-01-07 LAB — HSV(HERPES SMPLX)ABS-I+II(IGG+IGM)-BLD
HSV 1 Glycoprotein G Ab, IgG: 43.3 index — ABNORMAL HIGH (ref 0.00–0.90)
HSV 2 IgG, Type Spec: 12.1 index — ABNORMAL HIGH (ref 0.00–0.90)
HSVI/II Comb IgM: 1.44 Ratio — ABNORMAL HIGH (ref 0.00–0.90)

## 2019-01-07 LAB — RPR: RPR Ser Ql: NONREACTIVE

## 2019-01-11 ENCOUNTER — Telehealth: Payer: Self-pay | Admitting: Family Medicine

## 2019-01-11 NOTE — Telephone Encounter (Signed)
Multiple attempts to contact patient to review labs. All numbers listed in chart are incorrect.  

## 2019-01-11 NOTE — Telephone Encounter (Signed)
Multiple attempts to contact patient to review labs. All numbers listed in chart are incorrect.

## 2019-01-11 NOTE — Progress Notes (Signed)
Natalie need to speak with this pt. Please get updated phone number

## 2019-01-12 LAB — PAP IG W/ RFLX HPV ASCU

## 2019-03-07 ENCOUNTER — Ambulatory Visit (INDEPENDENT_AMBULATORY_CARE_PROVIDER_SITE_OTHER): Payer: Medicaid Other

## 2019-03-07 ENCOUNTER — Other Ambulatory Visit: Payer: Self-pay

## 2019-03-07 DIAGNOSIS — Z3042 Encounter for surveillance of injectable contraceptive: Secondary | ICD-10-CM

## 2019-03-07 LAB — POCT URINE PREGNANCY: Preg Test, Ur: NEGATIVE

## 2019-03-07 MED ORDER — MEDROXYPROGESTERONE ACETATE 150 MG/ML IM SUSP
150.0000 mg | Freq: Once | INTRAMUSCULAR | Status: AC
  Administered 2019-03-07: 10:00:00 150 mg via INTRAMUSCULAR

## 2019-05-23 ENCOUNTER — Other Ambulatory Visit: Payer: Self-pay

## 2019-05-23 ENCOUNTER — Ambulatory Visit (INDEPENDENT_AMBULATORY_CARE_PROVIDER_SITE_OTHER): Payer: Medicaid Other

## 2019-05-23 DIAGNOSIS — Z3042 Encounter for surveillance of injectable contraceptive: Secondary | ICD-10-CM | POA: Diagnosis not present

## 2019-05-23 LAB — POCT URINE PREGNANCY: Preg Test, Ur: NEGATIVE

## 2019-05-23 MED ORDER — MEDROXYPROGESTERONE ACETATE 150 MG/ML IM SUSP
150.0000 mg | Freq: Once | INTRAMUSCULAR | Status: AC
  Administered 2019-05-23: 150 mg via INTRAMUSCULAR

## 2019-07-06 ENCOUNTER — Ambulatory Visit: Payer: Medicaid Other | Admitting: Family Medicine

## 2019-08-16 ENCOUNTER — Ambulatory Visit: Payer: Medicaid Other | Admitting: Family Medicine

## 2019-09-16 ENCOUNTER — Other Ambulatory Visit: Payer: Self-pay

## 2019-09-16 ENCOUNTER — Ambulatory Visit (INDEPENDENT_AMBULATORY_CARE_PROVIDER_SITE_OTHER): Payer: Medicaid Other | Admitting: Family Medicine

## 2019-09-16 VITALS — BP 118/73 | HR 81 | Temp 98.1°F | Ht 65.0 in | Wt 199.0 lb

## 2019-09-16 DIAGNOSIS — Z09 Encounter for follow-up examination after completed treatment for conditions other than malignant neoplasm: Secondary | ICD-10-CM | POA: Diagnosis not present

## 2019-09-16 DIAGNOSIS — Z3042 Encounter for surveillance of injectable contraceptive: Secondary | ICD-10-CM

## 2019-09-16 LAB — POCT URINE PREGNANCY: Preg Test, Ur: NEGATIVE

## 2019-09-16 MED ORDER — MEDROXYPROGESTERONE ACETATE 150 MG/ML IM SUSP
150.0000 mg | Freq: Once | INTRAMUSCULAR | Status: AC
  Administered 2019-09-16: 150 mg via INTRAMUSCULAR

## 2019-09-16 NOTE — Progress Notes (Signed)
[facility]  Established Patient Office Visit  Subjective:  Patient ID: [name], female    DOB: 17-Oct-1980  Age: 39 y.o. MRN: [id number]  CC:  Chief Complaint  Patient presents with  . Contraception    depo shot; past due 5/31.    HPI [name] is a 39 year old female who presents for Follow Up today.  Patient Active Problem List   Diagnosis Date Noted  . Encounter for Depo-Provera contraception 12/07/2018  . OBESITY, NOS 05/21/2006    Past Medical History:  Diagnosis Date  . Appendicitis 2003  . Family history of thyroid problem   . Fracture of right ankle 1998-1999  . History of cholecystectomy 10/2017  . Initiation of Depo Provera 12/06/2018  . Pap smear for cervical cancer screening 01/05/2019  . Vitamin D deficiency 11/2018   Current Status: Since her last office visit, he is doing well with no complaints. She is here today to discuss contraception options. He denies fevers, chills, fatigue, recent infections, weight loss, and night sweats. He has not had any headaches, visual changes, dizziness, and falls. No chest pain, heart palpitations, cough and shortness of breath reported. Denies GI problems such as nausea, vomiting, diarrhea, and constipation. He has no reports of blood in stools, dysuria and hematuria. No depression or anxiety, and denies suicidal ideations, homicidal ideations, or auditory hallucinations. He is taking all medications as prescribed. He denies pain today.   Patient Active Problem List   Diagnosis Date Noted  . Encounter for Depo-Provera contraception 12/07/2018  . OBESITY, NOS 05/21/2006   Past Surgical History:  Procedure Laterality Date  . APPENDECTOMY    . CHOLECYSTECTOMY      Family History  Problem Relation Age of Onset  . Hypertension Mother   . High Cholesterol Mother   . Diabetes Father   . Heart failure Father   . Thyroid disease Sister   . Diabetes Maternal  Grandmother     Social History   Socioeconomic History  . Marital status: Single    Spouse name: Not on file  . Number of children: Not on file  . Years of education: Not on file  . Highest education level: Not on file  Occupational History  . Not on file  Tobacco Use  . Smoking status: Current Every Day Smoker    Types: Cigarettes  . Smokeless tobacco: Never Used  Vaping Use  . Vaping Use: Never used  Substance and Sexual Activity  . Alcohol use: Yes  . Drug use: Never  . Sexual activity: Not on file  Other Topics Concern  . Not on file  Social History Narrative  . Not on file   Social Determinants of Health   Financial Resource Strain:   . Difficulty of Paying Living Expenses:   Food Insecurity:   . Worried About Programme researcher, broadcasting/film/video in the Last Year:   . Barista in the Last Year:   Transportation Needs:   . Freight forwarder (Medical):   Marland Kitchen Lack of Transportation (Non-Medical):   Physical Activity:   . Days of Exercise per Week:   . Minutes of Exercise per Session:   Stress:   . Feeling of Stress :   Social Connections:   . Frequency of Communication with Friends and Family:   . Frequency of Social Gatherings with Friends and Family:   . Attends Religious Services:   . Active Member of  Clubs or Organizations:   . Attends Banker Meetings:   Marland Kitchen Marital Status:   Intimate Partner Violence:   . Fear of Current or Ex-Partner:   . Emotionally Abused:   Marland Kitchen Physically Abused:   . Sexually Abused:     Outpatient Medications Prior to Visit  Medication Sig Dispense Refill  . Vitamin D, Ergocalciferol, (DRISDOL) 1.25 MG (50000 UT) CAPS capsule Take 1 capsule (50,000 Units total) by mouth every 7 (seven) days. 5 capsule 6   Facility-Administered Medications Prior to Visit  Medication Dose Route Frequency Provider Last Rate Last Admin  . medroxyPROGESTERone (DEPO-PROVERA) injection 150 mg  150 mg Intramuscular Once Kallie Locks, FNP          No Known Allergies  ROS Review of Systems  Constitutional: Negative.   HENT: Negative.   Eyes: Negative.   Respiratory: Negative.   Cardiovascular: Negative.   Gastrointestinal: Negative.   Endocrine: Negative.   Genitourinary: Negative.   Musculoskeletal: Negative.   Skin: Negative.   Allergic/Immunologic: Negative.   Neurological: Negative.   Hematological: Negative.   Psychiatric/Behavioral: Negative.       Objective:    Physical Exam Vitals and nursing note reviewed.  Constitutional:      Appearance: Normal appearance.  HENT:     Head: Normocephalic and atraumatic.     Nose: Nose normal.     Mouth/Throat:     Mouth: Mucous membranes are moist.     Pharynx: Oropharynx is clear.  Cardiovascular:     Rate and Rhythm: Normal rate and regular rhythm.     Pulses: Normal pulses.     Heart sounds: Normal heart sounds.  Pulmonary:     Effort: Pulmonary effort is normal.     Breath sounds: Normal breath sounds.  Abdominal:     General: Abdomen is flat. Bowel sounds are normal.  Musculoskeletal:        General: Normal range of motion.     Cervical back: Normal range of motion and neck supple.  Skin:    General: Skin is warm and dry.  Neurological:     General: No focal deficit present.     Mental Status: She is alert and oriented to person, place, and time.  Psychiatric:        Mood and Affect: Mood normal.        Behavior: Behavior normal.        Thought Content: Thought content normal.        Judgment: Judgment normal.     BP 118/73 (BP Location: Left Arm, Patient Position: Sitting, Cuff Size: Large)   Pulse 81   Temp 98.1 F (36.7 C)   Ht 5\' 5"  (1.651 m)   Wt 199 lb 0.2 oz (90.3 kg)   SpO2 99%   BMI 33.12 kg/m  Wt Readings from Last 3 Encounters:  09/16/19 199 lb 0.2 oz (90.3 kg)  01/05/19 197 lb (89.4 kg)  12/06/18 195 lb 9.6 oz (88.7 kg)     Health Maintenance Due  Topic Date Due  . COVID-19 Vaccine (1) Never done  . TETANUS/TDAP  Never  done    There are no preventive care reminders to display for this patient.  Lab Results  Component Value Date   TSH 3.760 12/06/2018   Lab Results  Component Value Date   WBC 3.4 12/06/2018   HGB 13.2 12/06/2018   HCT 43.2 12/06/2018   MCV 75 (L) 12/06/2018   PLT 334 12/06/2018   Lab  Results  Component Value Date   NA 135 12/06/2018   K 4.8 12/06/2018   CO2 21 12/06/2018   GLUCOSE 87 12/06/2018   BUN 13 12/06/2018   CREATININE 0.96 12/06/2018   BILITOT 0.3 12/06/2018   ALKPHOS 70 12/06/2018   AST 29 12/06/2018   ALT 25 12/06/2018   PROT 7.5 12/06/2018   ALBUMIN 4.7 12/06/2018   CALCIUM 10.0 12/06/2018   Lab Results  Component Value Date   CHOL 205 (H) 12/06/2018   Lab Results  Component Value Date   HDL 60 12/06/2018   Lab Results  Component Value Date   LDLCALC 128 (H) 12/06/2018   Lab Results  Component Value Date   TRIG 94 12/06/2018   Lab Results  Component Value Date   CHOLHDL 3.4 12/06/2018   No results found for: HGBA1C    Assessment & Plan:   1. Encounter for Depo-Provera contraception - POCT urine pregnancy - medroxyPROGESTERone (DEPO-PROVERA) injection 150 mg  2. Encounter for management and injection of depo-Provera - medroxyPROGESTERone (DEPO-PROVERA) injection 150 mg  3. Follow up She will follow up in 3 months for Nurse Visit only for Depo Injection. She will follow up in 6 months for Office Visit/Labs and Depo Injection.   Meds ordered this encounter  Medications  . medroxyPROGESTERone (DEPO-PROVERA) injection 150 mg    Orders Placed This Encounter  Procedures  . POCT urine pregnancy    Referral Orders  No referral(s) requested today    Kathe Becton,  MSN, FNP-BC [place] [address], [place] [address] [phone]- fax   Problem List Items Addressed This Visit      Other   Encounter for  Depo-Provera contraception - Primary   Relevant Orders   POCT urine pregnancy (Completed)    Other Visit Diagnoses    Encounter for management and injection of depo-Provera       Relevant Medications   medroxyPROGESTERone (DEPO-PROVERA) injection 150 mg (Completed)   Follow up          Meds ordered this encounter  Medications  . medroxyPROGESTERone (DEPO-PROVERA) injection 150 mg    Follow-up: No follow-ups on file.    Azzie Glatter, FNP

## 2019-12-06 ENCOUNTER — Ambulatory Visit: Payer: Medicaid Other

## 2019-12-13 ENCOUNTER — Ambulatory Visit: Payer: Medicaid Other

## 2019-12-16 ENCOUNTER — Ambulatory Visit: Payer: Medicaid Other

## 2019-12-23 ENCOUNTER — Ambulatory Visit: Payer: Medicaid Other

## 2019-12-23 ENCOUNTER — Other Ambulatory Visit: Payer: Self-pay

## 2019-12-23 VITALS — BP 122/85 | HR 73 | Temp 98.6°F

## 2019-12-23 DIAGNOSIS — Z3042 Encounter for surveillance of injectable contraceptive: Secondary | ICD-10-CM

## 2019-12-23 MED ORDER — MEDROXYPROGESTERONE ACETATE 104 MG/0.65ML ~~LOC~~ SUSY: 104.0000 mg | PREFILLED_SYRINGE | Freq: Once | SUBCUTANEOUS | Status: DC

## 2019-12-23 NOTE — Patient Instructions (Signed)
Etonogestrel implant What is this medicine? ETONOGESTREL (et oh noe JES trel) is a contraceptive (birth control) device. It is used to prevent pregnancy. It can be used for up to 3 years. This medicine may be used for other purposes; ask your health care provider or pharmacist if you have questions. COMMON BRAND NAME(S): Implanon, Nexplanon What should I tell my health care provider before I take this medicine? They need to know if you have any of these conditions:  abnormal vaginal bleeding  blood vessel disease or blood clots  breast, cervical, endometrial, ovarian, liver, or uterine cancer  diabetes  gallbladder disease  heart disease or recent heart attack  high blood pressure  high cholesterol or triglycerides  kidney disease  liver disease  migraine headaches  seizures  stroke  tobacco smoker  an unusual or allergic reaction to etonogestrel, anesthetics or antiseptics, other medicines, foods, dyes, or preservatives  pregnant or trying to get pregnant  breast-feeding How should I use this medicine? This device is inserted just under the skin on the inner side of your upper arm by a health care professional. Talk to your pediatrician regarding the use of this medicine in children. Special care may be needed. Overdosage: If you think you have taken too much of this medicine contact a poison control center or emergency room at once. NOTE: This medicine is only for you. Do not share this medicine with others. What if I miss a dose? This does not apply. What may interact with this medicine? Do not take this medicine with any of the following medications:  amprenavir  fosamprenavir This medicine may also interact with the following medications:  acitretin  aprepitant  armodafinil  bexarotene  bosentan  carbamazepine  certain medicines for fungal infections like fluconazole, ketoconazole, itraconazole and voriconazole  certain medicines to treat  hepatitis, HIV or AIDS  cyclosporine  felbamate  griseofulvin  lamotrigine  modafinil  oxcarbazepine  phenobarbital  phenytoin  primidone  rifabutin  rifampin  rifapentine  St. John's wort  topiramate This list may not describe all possible interactions. Give your health care provider a list of all the medicines, herbs, non-prescription drugs, or dietary supplements you use. Also tell them if you smoke, drink alcohol, or use illegal drugs. Some items may interact with your medicine. What should I watch for while using this medicine? This product does not protect you against HIV infection (AIDS) or other sexually transmitted diseases. You should be able to feel the implant by pressing your fingertips over the skin where it was inserted. Contact your doctor if you cannot feel the implant, and use a non-hormonal birth control method (such as condoms) until your doctor confirms that the implant is in place. Contact your doctor if you think that the implant may have broken or become bent while in your arm. You will receive a user card from your health care provider after the implant is inserted. The card is a record of the location of the implant in your upper arm and when it should be removed. Keep this card with your health records. What side effects may I notice from receiving this medicine? Side effects that you should report to your doctor or health care professional as soon as possible:  allergic reactions like skin rash, itching or hives, swelling of the face, lips, or tongue  breast lumps, breast tissue changes, or discharge  breathing problems  changes in emotions or moods  coughing up blood  if you feel that the implant   may have broken or bent while in your arm  high blood pressure  pain, irritation, swelling, or bruising at the insertion site  scar at site of insertion  signs of infection at the insertion site such as fever, and skin redness, pain or  discharge  signs and symptoms of a blood clot such as breathing problems; changes in vision; chest pain; severe, sudden headache; pain, swelling, warmth in the leg; trouble speaking; sudden numbness or weakness of the face, arm or leg  signs and symptoms of liver injury like dark yellow or brown urine; general ill feeling or flu-like symptoms; light-colored stools; loss of appetite; nausea; right upper belly pain; unusually weak or tired; yellowing of the eyes or skin  unusual vaginal bleeding, discharge Side effects that usually do not require medical attention (report to your doctor or health care professional if they continue or are bothersome):  acne  breast pain or tenderness  headache  irregular menstrual bleeding  nausea This list may not describe all possible side effects. Call your doctor for medical advice about side effects. You may report side effects to FDA at 1-800-FDA-1088. Where should I keep my medicine? This drug is given in a hospital or clinic and will not be stored at home. NOTE: This sheet is a summary. It may not cover all possible information. If you have questions about this medicine, talk to your doctor, pharmacist, or health care provider.  2020 Elsevier/Gold Standard (2018-12-21 11:33:04)  

## 2019-12-26 MED ORDER — MEDROXYPROGESTERONE ACETATE 150 MG/ML IM SUSY
150.0000 mg | PREFILLED_SYRINGE | Freq: Once | INTRAMUSCULAR | Status: AC
  Administered 2019-12-23: 150 mg via INTRAMUSCULAR

## 2020-03-12 ENCOUNTER — Other Ambulatory Visit: Payer: Self-pay | Admitting: Family Medicine

## 2020-03-12 ENCOUNTER — Telehealth: Payer: Self-pay

## 2020-03-12 ENCOUNTER — Other Ambulatory Visit: Payer: Self-pay

## 2020-03-12 ENCOUNTER — Ambulatory Visit (INDEPENDENT_AMBULATORY_CARE_PROVIDER_SITE_OTHER): Payer: Medicaid Other | Admitting: Family Medicine

## 2020-03-12 DIAGNOSIS — Z3009 Encounter for other general counseling and advice on contraception: Secondary | ICD-10-CM

## 2020-03-12 DIAGNOSIS — Z3042 Encounter for surveillance of injectable contraceptive: Secondary | ICD-10-CM

## 2020-03-12 DIAGNOSIS — N946 Dysmenorrhea, unspecified: Secondary | ICD-10-CM

## 2020-03-12 DIAGNOSIS — Z09 Encounter for follow-up examination after completed treatment for conditions other than malignant neoplasm: Secondary | ICD-10-CM | POA: Diagnosis not present

## 2020-03-12 LAB — POCT URINE PREGNANCY: Preg Test, Ur: NEGATIVE

## 2020-03-12 MED ORDER — IBUPROFEN 800 MG PO TABS: 800.0000 mg | ORAL_TABLET | Freq: Three times a day (TID) | ORAL | 3 refills | Status: AC | PRN

## 2020-03-12 MED ORDER — MEDROXYPROGESTERONE ACETATE 150 MG/ML IM SUSY
150.0000 mg | PREFILLED_SYRINGE | Freq: Once | INTRAMUSCULAR | Status: AC
  Administered 2020-03-12: 150 mg via INTRAMUSCULAR

## 2020-03-12 NOTE — Telephone Encounter (Signed)
Requesting a prescription for Ibuprofen due to sometimes having menstrual cramps

## 2020-03-12 NOTE — Progress Notes (Signed)
[facility]   Established Patient Office Visit  Subjective:  Patient ID: [name], female    DOB: 06-07-1980  Age: 39 y.o. MRN: [id number]  CC:  Chief Complaint  Patient presents with  . Injections    Here for Depo injection    HPI [name] is a 39 year old female who presents for Follow Up today.    Patient Active Problem List   Diagnosis Date Noted  . Encounter for Depo-Provera contraception 12/07/2018  . OBESITY, NOS 05/21/2006   Current Status: Since her last office visit, she is doing well with no complaints.  She denies fevers, chills, fatigue, recent infections, weight loss, and night sweats. She has not had any headaches, visual changes, dizziness, and falls. No chest pain, heart palpitations, cough and shortness of breath reported. Denies GI problems such as nausea, vomiting, diarrhea, and constipation. She has no reports of blood in stools, dysuria and hematuria. No depression or anxiety, and denies suicidal ideations, homicidal ideations, or auditory hallucinations. She is taking all medications as prescribed. She denies pain today.   Past Medical History:  Diagnosis Date  . Appendicitis 2003  . Family history of thyroid problem   . Fracture of right ankle 1998-1999  . History of cholecystectomy 10/2017  . Initiation of Depo Provera 12/06/2018  . Pap smear for cervical cancer screening 01/05/2019  . Vitamin D deficiency 11/2018    Past Surgical History:  Procedure Laterality Date  . APPENDECTOMY    . CHOLECYSTECTOMY      Family History  Problem Relation Age of Onset  . Hypertension Mother   . High Cholesterol Mother   . Diabetes Father   . Heart failure Father   . Thyroid disease Sister   . Diabetes Maternal Grandmother     Social History   Socioeconomic History  . Marital status: Single    Spouse name: Not on file  . Number of children: Not on file  . Years of education: Not on file   . Highest education level: Not on file  Occupational History  . Not on file  Tobacco Use  . Smoking status: Current Every Day Smoker    Types: Cigarettes  . Smokeless tobacco: Never Used  Vaping Use  . Vaping Use: Never used  Substance and Sexual Activity  . Alcohol use: Yes  . Drug use: Never  . Sexual activity: Not on file  Other Topics Concern  . Not on file  Social History Narrative  . Not on file   Social Determinants of Health   Financial Resource Strain: Not on file  Food Insecurity: Not on file  Transportation Needs: Not on file  Physical Activity: Not on file  Stress: Not on file  Social Connections: Not on file  Intimate Partner Violence: Not on file    Outpatient Medications Prior to Visit  Medication Sig Dispense Refill  . Vitamin D, Ergocalciferol, (DRISDOL) 1.25 MG (50000 UT) CAPS capsule Take 1 capsule (50,000 Units total) by mouth every 7 (seven) days. 5 capsule 6   Facility-Administered Medications Prior to Visit  Medication Dose Route Frequency Provider Last Rate Last Admin  . medroxyPROGESTERone (DEPO-PROVERA) injection 150 mg  150 mg Intramuscular Once Raliegh Ip M, FNP      . medroxyPROGESTERone (DEPO-SUBQ PROVERA 104) injection 104 mg  104 mg Subcutaneous Once Kallie Locks, FNP        No Known Allergies  ROS Review of Systems  Constitutional: Negative.   HENT: Negative.   Eyes: Negative.   Respiratory: Negative.   Cardiovascular: Negative.   Gastrointestinal: Negative.   Endocrine: Negative.   Genitourinary: Negative.   Musculoskeletal: Negative.   Skin: Negative.   Allergic/Immunologic: Negative.   Neurological: Positive for dizziness (occasional ) and headaches (occasional ).  Hematological: Negative.   Psychiatric/Behavioral: Negative.       Objective:    Physical Exam Vitals and nursing note reviewed.  Constitutional:      Appearance: Normal appearance.  HENT:     Head: Normocephalic and atraumatic.     Nose:  Nose normal.     Mouth/Throat:     Mouth: Mucous membranes are moist.     Pharynx: Oropharynx is clear.  Cardiovascular:     Rate and Rhythm: Normal rate and regular rhythm.     Pulses: Normal pulses.     Heart sounds: Normal heart sounds.  Pulmonary:     Effort: Pulmonary effort is normal.     Breath sounds: Normal breath sounds.  Abdominal:     General: Bowel sounds are normal.     Palpations: Abdomen is soft.  Musculoskeletal:        General: Normal range of motion.     Cervical back: Normal range of motion and neck supple.  Skin:    General: Skin is warm and dry.  Neurological:     General: No focal deficit present.     Mental Status: She is alert and oriented to person, place, and time.  Psychiatric:        Mood and Affect: Mood normal.        Behavior: Behavior normal.        Thought Content: Thought content normal.        Judgment: Judgment normal.     There were no vitals taken for this visit. Wt Readings from Last 3 Encounters:  09/16/19 199 lb 0.2 oz (90.3 kg)  01/05/19 197 lb (89.4 kg)  12/06/18 195 lb 9.6 oz (88.7 kg)     Health Maintenance Due  Topic Date Due  . COVID-19 Vaccine (1) Never done  . TETANUS/TDAP  Never done  . INFLUENZA VACCINE  10/23/2019    There are no preventive care reminders to display for this patient.  Lab Results  Component Value Date   TSH 3.760 12/06/2018   Lab Results  Component Value Date   WBC 3.4 12/06/2018   HGB 13.2 12/06/2018   HCT 43.2 12/06/2018   MCV 75 (L) 12/06/2018   PLT 334 12/06/2018   Lab Results  Component Value Date   NA 135 12/06/2018   K 4.8 12/06/2018   CO2 21 12/06/2018   GLUCOSE 87 12/06/2018   BUN 13 12/06/2018   CREATININE 0.96 12/06/2018   BILITOT 0.3 12/06/2018   ALKPHOS 70 12/06/2018   AST 29 12/06/2018   ALT 25 12/06/2018   PROT 7.5 12/06/2018   ALBUMIN 4.7 12/06/2018   CALCIUM 10.0 12/06/2018   Lab Results  Component Value Date   CHOL 205 (H) 12/06/2018   Lab Results   Component Value Date   HDL 60 12/06/2018   Lab Results  Component Value Date   LDLCALC 128 (H) 12/06/2018   Lab Results  Component Value Date   TRIG 94 12/06/2018   Lab Results  Component Value Date   CHOLHDL 3.4 12/06/2018   No results found for: HGBA1C    Assessment & Plan:   1. Encounter for Depo-Provera contraception -  POCT urine pregnancy - medroxyPROGESTERone Acetate SUSY 150 mg  2. Counseling for birth control, oral contraceptives  3. Follow up She will keep follow up appointment as scheduled.  Meds ordered this encounter  Medications  . medroxyPROGESTERone Acetate SUSY 150 mg    Orders Placed This Encounter  Procedures  . POCT urine pregnancy    Referral Orders  No referral(s) requested today    Raliegh Ip, MSN, ANE, FNP-BC [facility] [address] [place], [place] [address] [phone]- fax   Problem List Items Addressed This Visit      Other   Encounter for Depo-Provera contraception - Primary   Relevant Orders   POCT urine pregnancy    Other Visit Diagnoses    Counseling for birth control, oral contraceptives       Follow up          Meds ordered this encounter  Medications  . medroxyPROGESTERone Acetate SUSY 150 mg    Follow-up: No follow-ups on file.    Kallie Locks, FNP

## 2020-03-17 ENCOUNTER — Encounter: Payer: Self-pay | Admitting: Family Medicine

## 2020-04-13 NOTE — Progress Notes (Signed)
Patient arrived for Depo-Provera injection. Tolerated well. Informed patient next injection to be from 05/30/2019-06/12/2020

## 2020-05-19 ENCOUNTER — Encounter (HOSPITAL_COMMUNITY): Payer: Self-pay | Admitting: *Deleted

## 2020-05-19 ENCOUNTER — Ambulatory Visit (HOSPITAL_COMMUNITY)
Admission: EM | Admit: 2020-05-19 | Discharge: 2020-05-19 | Disposition: A | Discharge status: Home / Self Care | Payer: Medicaid Other | Attending: Emergency Medicine | Admitting: Emergency Medicine

## 2020-05-19 ENCOUNTER — Other Ambulatory Visit: Payer: Self-pay

## 2020-05-19 DIAGNOSIS — S80862A Insect bite (nonvenomous), left lower leg, initial encounter: Secondary | ICD-10-CM

## 2020-05-19 DIAGNOSIS — R21 Rash and other nonspecific skin eruption: Secondary | ICD-10-CM

## 2020-05-19 DIAGNOSIS — R030 Elevated blood-pressure reading, without diagnosis of hypertension: Secondary | ICD-10-CM

## 2020-05-19 DIAGNOSIS — W57XXXA Bitten or stung by nonvenomous insect and other nonvenomous arthropods, initial encounter: Secondary | ICD-10-CM | POA: Diagnosis not present

## 2020-05-19 NOTE — ED Triage Notes (Signed)
Two days ago Pt was bite by a spider. Pt now has pain to her Lt leg lower posterior calf.  Two raised spots are visible .

## 2020-05-19 NOTE — ED Provider Notes (Signed)
[facility]    CSN: [id number] Arrival date & time: 05/19/20  1520      History   Chief Complaint Chief Complaint  Patient presents with  . Insect Bite    Lt    HPI [name] is a 40 y.o. female.  Patient presents with insect bites on her left lower leg which occurred 2 days ago. She did not see what bit her but saw a spider in the general area. She reports localized pain and redness. She denies fever, chills, open wounds, drainage, numbness, weakness, or other symptoms. No treatments attempted at home. Patient requests a work note because she stands on her feet the entire time at work. Her medical history includes vitamin D deficiency, obesity.  The history is provided by the patient and medical records.    Past Medical History:  Diagnosis Date  . Appendicitis 2003  . Family history of thyroid problem   . Fracture of right ankle 1998-1999  . History of cholecystectomy 10/2017  . Initiation of Depo Provera 12/06/2018  . Pap smear for cervical cancer screening 01/05/2019  . Vitamin D deficiency 11/2018    Patient Active Problem List   Diagnosis Date Noted  . Encounter for Depo-Provera contraception 12/07/2018  . OBESITY, NOS 05/21/2006    Past Surgical History:  Procedure Laterality Date  . APPENDECTOMY    . CHOLECYSTECTOMY      OB History   No obstetric history on file.      Home Medications    Prior to Admission medications   Medication Sig Start Date End Date Taking? Authorizing Provider  ibuprofen (ADVIL) 800 MG tablet Take 1 tablet (800 mg total) by mouth every 8 (eight) hours as needed. 03/12/20   Kallie Locks, FNP  Vitamin D, Ergocalciferol, (DRISDOL) 1.25 MG (50000 UT) CAPS capsule Take 1 capsule (50,000 Units total) by mouth every 7 (seven) days. 12/09/18   Kallie Locks, FNP    Family History Family History  Problem Relation Age of Onset  . Hypertension Mother   . High Cholesterol Mother   . Diabetes Father   . Heart  failure Father   . Thyroid disease Sister   . Diabetes Maternal Grandmother     Social History Social History   Tobacco Use  . Smoking status: Current Every Day Smoker    Types: Cigarettes  . Smokeless tobacco: Never Used  Vaping Use  . Vaping Use: Never used  Substance Use Topics  . Alcohol use: Yes  . Drug use: Never     Allergies   Patient has no known allergies.   Review of Systems Review of Systems  Constitutional: Negative for chills and fever.  HENT: Negative for ear pain and sore throat.   Eyes: Negative for pain and visual disturbance.  Respiratory: Negative for cough and shortness of breath.   Cardiovascular: Negative for chest pain and palpitations.  Gastrointestinal: Negative for abdominal pain and vomiting.  Genitourinary: Negative for dysuria and hematuria.  Musculoskeletal: Negative for arthralgias and back pain.  Skin: Positive for wound. Negative for color change.  Neurological: Negative for syncope, weakness and numbness.  All other systems reviewed and are negative.    Physical Exam Triage Vital Signs ED Triage Vitals  Enc Vitals Group     BP      Pulse      Resp      Temp      Temp src      SpO2  Weight      Height      Head Circumference      Peak Flow      Pain Score      Pain Loc      Pain Edu?      Excl. in GC?    No data found.  Updated Vital Signs BP 140/89 (BP Location: Left Arm)   Pulse (!) 104   Temp 99 F (37.2 C) (Oral)   Resp 18   SpO2 100%   Visual Acuity Right Eye Distance:   Left Eye Distance:   Bilateral Distance:    Right Eye Near:   Left Eye Near:    Bilateral Near:     Physical Exam Vitals and nursing note reviewed.  Constitutional:      General: She is not in acute distress.    Appearance: She is well-developed and well-nourished. She is not ill-appearing.  HENT:     Head: Normocephalic and atraumatic.     Mouth/Throat:     Mouth: Mucous membranes are moist.  Eyes:      Conjunctiva/sclera: Conjunctivae normal.  Cardiovascular:     Rate and Rhythm: Normal rate and regular rhythm.     Heart sounds: Normal heart sounds.  Pulmonary:     Effort: Pulmonary effort is normal. No respiratory distress.     Breath sounds: Normal breath sounds.  Abdominal:     Palpations: Abdomen is soft.     Tenderness: There is no abdominal tenderness.  Musculoskeletal:        General: Swelling and tenderness present. No edema. Normal range of motion.     Cervical back: Neck supple.       Legs:     Comments: See picture for details.   Skin:    General: Skin is warm and dry.     Findings: Erythema and lesion present. No bruising.  Neurological:     General: No focal deficit present.     Mental Status: She is alert and oriented to person, place, and time.     Sensory: No sensory deficit.     Motor: No weakness.     Gait: Gait normal.  Psychiatric:        Mood and Affect: Mood and affect and mood normal.        Behavior: Behavior normal.        UC Treatments / Results  Labs (all labs ordered are listed, but only abnormal results are displayed) Labs Reviewed - No data to display  EKG   Radiology No results found.  Procedures Procedures (including critical care time)  Medications Ordered in UC Medications - No data to display  Initial Impression / Assessment and Plan / UC Course  I have reviewed the triage vital signs and the nursing notes.  Pertinent labs & imaging results that were available during my care of the patient were reviewed by me and considered in my medical decision making (see chart for details).   Rash due to insect bites on left lower leg. Elevated blood pressure reading. Discussed ibuprofen as needed for discomfort and Benadryl as needed for insect bite reaction. Precautions for drowsiness with Benadryl discussed. Instructed patient to follow-up with her PCP or return here if she notes signs of infection. Discussed that her blood pressure  is elevated today needs to be rechecked by her PCP in 1 to 2 weeks. She agrees to plan of care.   Final Clinical Impressions(s) / UC Diagnoses   Final diagnoses:  Insect bite of left lower leg, initial encounter  Rash  Elevated blood pressure reading     Discharge Instructions     Take ibuprofen as needed for discomfort. Take Benadryl as directed; not drive, operate machinery, or drink alcohol with this medication as it may cause drowsiness.    Follow-up with your primary care provider or return here if you note signs of infection as discussed.    Your blood pressure is elevated today at 151/112; recheck 140/89.  Please have this rechecked by your primary care provider in 1-2 weeks.             ED Prescriptions    None     PDMP not reviewed this encounter.   Mickie Bail, NP 05/19/20 1610

## 2020-05-19 NOTE — Discharge Instructions (Addendum)
Take ibuprofen as needed for discomfort. Take Benadryl as directed; not drive, operate machinery, or drink alcohol with this medication as it may cause drowsiness.    Follow-up with your primary care provider or return here if you note signs of infection as discussed.    Your blood pressure is elevated today at 151/112; recheck 140/89.  Please have this rechecked by your primary care provider in 1-2 weeks.

## 2020-05-21 ENCOUNTER — Other Ambulatory Visit: Payer: Self-pay

## 2020-05-21 ENCOUNTER — Emergency Department (HOSPITAL_COMMUNITY)
Admission: EM | Admit: 2020-05-21 | Discharge: 2020-05-21 | Disposition: A | Discharge status: Home / Self Care | Payer: Medicaid Other | Attending: Emergency Medicine | Admitting: Emergency Medicine

## 2020-05-21 ENCOUNTER — Encounter (HOSPITAL_COMMUNITY): Payer: Self-pay

## 2020-05-21 DIAGNOSIS — W57XXXD Bitten or stung by nonvenomous insect and other nonvenomous arthropods, subsequent encounter: Secondary | ICD-10-CM | POA: Insufficient documentation

## 2020-05-21 DIAGNOSIS — F17210 Nicotine dependence, cigarettes, uncomplicated: Secondary | ICD-10-CM | POA: Diagnosis not present

## 2020-05-21 DIAGNOSIS — S80862D Insect bite (nonvenomous), left lower leg, subsequent encounter: Secondary | ICD-10-CM | POA: Insufficient documentation

## 2020-05-21 MED ORDER — NAPROXEN 500 MG PO TABS
500.0000 mg | ORAL_TABLET | Freq: Once | ORAL | Status: AC
  Administered 2020-05-21: 500 mg via ORAL
  Filled 2020-05-21: qty 1

## 2020-05-21 MED ORDER — CEPHALEXIN 500 MG PO CAPS
500.0000 mg | ORAL_CAPSULE | Freq: Once | ORAL | Status: AC
  Administered 2020-05-21: 500 mg via ORAL
  Filled 2020-05-21: qty 1

## 2020-05-21 MED ORDER — NAPROXEN 500 MG PO TABS: 500.0000 mg | ORAL_TABLET | Freq: Two times a day (BID) | ORAL | 0 refills | Status: AC

## 2020-05-21 MED ORDER — CEPHALEXIN 500 MG PO CAPS: 500.0000 mg | ORAL_CAPSULE | Freq: Four times a day (QID) | ORAL | 0 refills | Status: DC

## 2020-05-21 NOTE — ED Provider Notes (Signed)
[facility]-EMERGENCY DEPT Provider Note   CSN: [id number] Arrival date & time: 05/21/20  0957     History Chief Complaint  Patient presents with  . Insect Bite    [name] is a 40 y.o. female with a past medical history significant for vitamin D deficiency who presents to the ED due to suspected spider bite x6 days.  Patient admits to left calf pain worse with movement associated with mild edema and erythema.  She has been taking ibuprofen with moderate relief.  Denies fever and chills.  Denies drainage from site.  Chart reviewed.  Patient was seen at urgent care on 2/26 for same complaint and advised to take ibuprofen and Benadryl.  Patient denies chest pain, shortness of breath, history of blood clots, recent surgeries, recent long immobilizations, hemoptysis.  She is on Depo for birth control.   History obtained from patient and past medical records. No interpreter used during encounter.      Past Medical History:  Diagnosis Date  . Appendicitis 2003  . Family history of thyroid problem   . Fracture of right ankle 1998-1999  . History of cholecystectomy 10/2017  . Initiation of Depo Provera 12/06/2018  . Pap smear for cervical cancer screening 01/05/2019  . Vitamin D deficiency 11/2018    Patient Active Problem List   Diagnosis Date Noted  . Encounter for Depo-Provera contraception 12/07/2018  . OBESITY, NOS 05/21/2006    Past Surgical History:  Procedure Laterality Date  . APPENDECTOMY    . CHOLECYSTECTOMY       OB History   No obstetric history on file.     Family History  Problem Relation Age of Onset  . Hypertension Mother   . High Cholesterol Mother   . Diabetes Father   . Heart failure Father   . Thyroid disease Sister   . Diabetes Maternal Grandmother     Social History   Tobacco Use  . Smoking status: Current Every Day Smoker    Packs/day: 0.30    Types: Cigarettes  . Smokeless tobacco: Never Used  Vaping Use  .  Vaping Use: Never used  Substance Use Topics  . Alcohol use: Yes  . Drug use: Never    Home Medications Prior to Admission medications   Medication Sig Start Date End Date Taking? Authorizing Provider  cephALEXin (KEFLEX) 500 MG capsule Take 1 capsule (500 mg total) by mouth 4 (four) times daily. 05/21/20  Yes Aberman, Caroline C, PA-C  naproxen (NAPROSYN) 500 MG tablet Take 1 tablet (500 mg total) by mouth 2 (two) times daily. 05/21/20  Yes Aberman, Merla Riches, PA-C  ibuprofen (ADVIL) 800 MG tablet Take 1 tablet (800 mg total) by mouth every 8 (eight) hours as needed. 03/12/20   Kallie Locks, FNP  Vitamin D, Ergocalciferol, (DRISDOL) 1.25 MG (50000 UT) CAPS capsule Take 1 capsule (50,000 Units total) by mouth every 7 (seven) days. 12/09/18   Kallie Locks, FNP    Allergies    Patient has no known allergies.  Review of Systems   Review of Systems  Constitutional: Negative for chills and fever.  Respiratory: Negative for shortness of breath.   Cardiovascular: Negative for chest pain.  Skin: Positive for color change and wound.  All other systems reviewed and are negative.   Physical Exam Updated Vital Signs BP (!) 132/97   Pulse 87   Temp 98.9 F (37.2 C) (Oral)   Resp 18   Ht 5\' 5"  (1.651 m)  Wt 86.2 kg   SpO2 99%   BMI 31.62 kg/m   Physical Exam Vitals and nursing note reviewed.  Constitutional:      General: She is not in acute distress.    Appearance: She is not ill-appearing.  HENT:     Head: Normocephalic.  Eyes:     Pupils: Pupils are equal, round, and reactive to light.  Cardiovascular:     Rate and Rhythm: Normal rate and regular rhythm.     Pulses: Normal pulses.     Heart sounds: Normal heart sounds. No murmur heard. No friction rub. No gallop.   Pulmonary:     Effort: Pulmonary effort is normal.     Breath sounds: Normal breath sounds.  Abdominal:     General: Abdomen is flat. Bowel sounds are normal. There is no distension.     Palpations:  Abdomen is soft.     Tenderness: There is no abdominal tenderness. There is no guarding or rebound.  Musculoskeletal:     Cervical back: Neck supple.     Comments: Full range of motion of left knee and ankle.  Distal pulses and sensation intact.  Skin:    Comments: Erythema on lateral aspect of left calf with 2 small insect bites.  No induration or fluctuance.  Neurological:     General: No focal deficit present.     Mental Status: She is alert.  Psychiatric:        Mood and Affect: Mood normal.        Behavior: Behavior normal.     ED Results / Procedures / Treatments   Labs (all labs ordered are listed, but only abnormal results are displayed) Labs Reviewed - No data to display  EKG None  Radiology No results found.  Procedures Ultrasound ED Soft Tissue  Date/Time: 05/21/2020 12:55 PM Performed by: Mannie Stabile, PA-C Authorized by: Mannie Stabile, PA-C   Procedure details:    Indications: limb pain, localization of abscess and evaluate for cellulitis     Transverse view:  Visualized   Longitudinal view:  Visualized   Images: archived     Limitations:  Body habitus Location:    Location: lower extremity     Side:  Left Findings:     no abscess present    cellulitis present    no foreign body present     Medications Ordered in ED Medications  naproxen (NAPROSYN) tablet 500 mg (500 mg Oral Given 05/21/20 1224)  cephALEXin (KEFLEX) capsule 500 mg (500 mg Oral Given 05/21/20 1225)    ED Course  I have reviewed the triage vital signs and the nursing notes.  Pertinent labs & imaging results that were available during my care of the patient were reviewed by me and considered in my medical decision making (see chart for details).    MDM Rules/Calculators/A&P                         40 year old female presents to the ED due to an insect bite which she believes was a spider that occurred 6 days ago.  Patient denies fever and chills.  Patient evaluated  in urgent care 2 days ago for same complaint and advised to take ibuprofen and Benadryl as needed.  Patient returns to the ED due to continued pain.  Patient noted to be tachycardic at 114 with otherwise stable vitals.  During my initial evaluation roughly 2 hours after presentation patient's heart rate in the  80s.  And in no acute distress and nontoxic-appearing.  Low suspicion for sepsis.  Physical exam significant for tenderness on lateral aspect of left calf with mild erythema with 2 insect bites.  No fluctuance or induration.  Bedside ultrasound performed which does not demonstrate abscess formation. Mild cobblestoning. Patient given naproxen and Keflex here in the ED.  Low suspicion for PE/DVT.  Suspect symptoms related to insect bite given appearance of wound on left lower extremity.  Left lower extremity neurovascularly intact.  Patient discharged with naproxen and Keflex.  Advised patient to follow-up with PCP symptoms not improved within the next week. Strict ED precautions discussed with patient. Patient states understanding and agrees to plan. Patient discharged home in no acute distress and stable vitals  Final Clinical Impression(s) / ED Diagnoses Final diagnoses:  Insect bite of left lower leg, subsequent encounter    Rx / DC Orders ED Discharge Orders         Ordered    naproxen (NAPROSYN) 500 MG tablet  2 times daily        05/21/20 1228    cephALEXin (KEFLEX) 500 MG capsule  4 times daily        05/21/20 1228           Mannie Stabile, [place] 05/21/20 1256    Tegeler, Canary Brim, MD 05/21/20 1739

## 2020-05-21 NOTE — ED Notes (Signed)
An After Visit Summary was printed and given to the patient. Discharge instructions given and no further questions at this time.  

## 2020-05-21 NOTE — ED Notes (Signed)
HR is currently 86/bpm, pt is calm and verbal.

## 2020-05-21 NOTE — Discharge Instructions (Addendum)
As discussed, your pain and mild swelling is likely due to an insect bite.  I am sending you home with an antibiotic and pain medication.  You may take the pain medication twice a day as needed.  Do not mix with other over-the-counter pain medications.  Use a warm compress to area 5 times daily. Please follow-up with PCP if symptoms not improved within the next week.  Return to the ER for new or worsening symptoms.

## 2020-05-21 NOTE — ED Triage Notes (Signed)
Patient states she was bit by a spider 6 days ago. Patient has redness and swelling of the left calf.

## 2020-05-22 ENCOUNTER — Telehealth: Payer: Self-pay | Admitting: *Deleted

## 2020-05-22 NOTE — Telephone Encounter (Signed)
Transition Care Management Unsuccessful Follow-up Telephone Call  Date of discharge and from where: 05-21-2020 [facility]  Attempts:  1  Reason for unsuccessful TCM follow-up call: no answer left message

## 2020-05-23 ENCOUNTER — Telehealth: Payer: Self-pay | Admitting: *Deleted

## 2020-05-23 NOTE — Telephone Encounter (Signed)
Transition Care Management Follow-up Telephone Call  Date of discharge and from where: 05/21/2020 - [facility]  How have you been since you were released from the hospital? "Feeling much better"  Any questions or concerns? No  Items Reviewed:  Did the pt receive and understand the discharge instructions provided? Yes   Medications obtained and verified? Yes   Other? No   Any new allergies since your discharge? No   Dietary orders reviewed? Yes  Do you have support at home? Yes   Home Care and Equipment/Supplies: Were home health services ordered? not applicable If so, what is the name of the agency? N/A  Has the agency set up a time to come to the patient's home? not applicable Were any new equipment or medical supplies ordered?  No What is the name of the medical supply agency? N/A Were you able to get the supplies/equipment? not applicable Do you have any questions related to the use of the equipment or supplies? No  Functional Questionnaire: (I = Independent and D = Dependent) ADLs: I  Bathing/Dressing- I  Meal Prep- I  Eating- I  Maintaining continence- I  Transferring/Ambulation- I  Managing Meds- I  Follow up appointments reviewed:   PCP Hospital f/u appt confirmed? No    Specialist Hospital f/u appt confirmed? No    Are transportation arrangements needed? No   If their condition worsens, is the pt aware to call PCP or go to the Emergency Dept.? Yes  Was the patient provided with contact information for the PCP's office or ED? Yes  Was to pt encouraged to call back with questions or concerns? Yes

## 2020-05-29 NOTE — Progress Notes (Signed)
Date last pap: 01/05/2019. Last Depo-Provera: 03/12/2020. Side Effects if any: N/A patient tolerated well. Depo-Provera 150 mg IM given by: S. Oshay Stranahan, CMA(AAMA). Next appointment due May 25-June 8

## 2020-05-30 ENCOUNTER — Ambulatory Visit (INDEPENDENT_AMBULATORY_CARE_PROVIDER_SITE_OTHER): Payer: Medicaid Other

## 2020-05-30 ENCOUNTER — Other Ambulatory Visit: Payer: Self-pay

## 2020-05-30 DIAGNOSIS — Z3042 Encounter for surveillance of injectable contraceptive: Secondary | ICD-10-CM

## 2020-05-30 MED ORDER — MEDROXYPROGESTERONE ACETATE 150 MG/ML IM SUSP
150.0000 mg | Freq: Once | INTRAMUSCULAR | Status: AC
  Administered 2020-05-30: 150 mg via INTRAMUSCULAR

## 2020-08-30 ENCOUNTER — Ambulatory Visit (INDEPENDENT_AMBULATORY_CARE_PROVIDER_SITE_OTHER): Payer: Medicaid Other | Admitting: Nurse Practitioner

## 2020-08-30 ENCOUNTER — Other Ambulatory Visit: Payer: Self-pay

## 2020-08-30 DIAGNOSIS — Z3042 Encounter for surveillance of injectable contraceptive: Secondary | ICD-10-CM

## 2020-08-30 LAB — POCT URINE PREGNANCY: Preg Test, Ur: NEGATIVE

## 2020-08-30 MED ORDER — MEDROXYPROGESTERONE ACETATE 150 MG/ML IM SUSP
150.0000 mg | Freq: Once | INTRAMUSCULAR | Status: AC
  Administered 2020-08-30: 150 mg via INTRAMUSCULAR

## 2020-08-30 NOTE — Progress Notes (Signed)
Patient came in for depo injection. Patient tolerated well.  

## 2020-11-09 ENCOUNTER — Telehealth: Payer: Self-pay

## 2020-11-12 ENCOUNTER — Encounter: Payer: Self-pay | Admitting: Nurse Practitioner

## 2020-11-12 ENCOUNTER — Other Ambulatory Visit: Payer: Self-pay

## 2020-11-12 ENCOUNTER — Ambulatory Visit (INDEPENDENT_AMBULATORY_CARE_PROVIDER_SITE_OTHER): Payer: Medicaid Other | Admitting: Nurse Practitioner

## 2020-11-12 ENCOUNTER — Ambulatory Visit: Payer: Medicaid Other | Admitting: Nurse Practitioner

## 2020-11-12 VITALS — BP 127/94 | Temp 97.2°F | Ht 65.0 in | Wt 192.0 lb

## 2020-11-12 DIAGNOSIS — N939 Abnormal uterine and vaginal bleeding, unspecified: Secondary | ICD-10-CM

## 2020-11-12 DIAGNOSIS — Z862 Personal history of diseases of the blood and blood-forming organs and certain disorders involving the immune mechanism: Secondary | ICD-10-CM

## 2020-11-12 NOTE — Progress Notes (Signed)
[facility] Patient [name] [address] [name], [place]  [address] Phone:  [phone]   Fax:  [phone] Subjective:   Patient ID: [name], female    DOB: Feb 13, 1981, 40 y.o.   MRN: [id number]  Chief Complaint  Patient presents with   Acute Visit    Last depo in June, started bleeding last Tuesday. Using long maxi pads. Passing some clots. Requesting work note    [name] 40 y.o. female presents with Vaginal Bleeding The patient's primary symptoms include missed menses and vaginal bleeding. The patient's pertinent negatives include no genital itching, genital lesions, genital odor, genital rash or pelvic pain. This is a new problem. The current episode started in the past 7 days. The problem has been gradually improving. The patient is experiencing no pain. She is not pregnant. Pertinent negatives include no abdominal pain, anorexia, back pain, chills, constipation, diarrhea, dysuria, fever, flank pain, nausea, painful intercourse, urgency or vomiting. The vaginal discharge was bloody and copious. The vaginal bleeding is heavier than menses. She has been passing clots. She has not been passing tissue. Nothing aggravates the symptoms. She has tried nothing for the symptoms. She is sexually active. No, her partner does not have an STD. She uses progestin injections for contraception. Her menstrual history has been irregular (Last mesntrual cycle several months ago). Her past medical history is significant for an STD.  At the initiation of symptoms 6 days ago, patient used 2-3 pads in 2hrs. Yesterday she used 4-5 heavy pads in 2 hrs. Today bleeding is mild with some clots. Endorses history of iron deficiency anemia. Patient requests to discontinue Depo injection.    Past Medical History:  Diagnosis Date   Appendicitis 2003   Family history of thyroid problem    Fracture of right ankle 1998-1999   History of cholecystectomy 10/2017   Initiation of Depo Provera  12/06/2018   Pap smear for cervical cancer screening 01/05/2019   Vitamin D deficiency 11/2018    Past Surgical History:  Procedure Laterality Date   APPENDECTOMY     CHOLECYSTECTOMY      Family History  Problem Relation Age of Onset   Hypertension Mother    High Cholesterol Mother    Diabetes Father    Heart failure Father    Thyroid disease Sister    Diabetes Maternal Grandmother     Social History   Socioeconomic History   Marital status: Single    Spouse name: Not on file   Number of children: Not on file   Years of education: Not on file   Highest education level: Not on file  Occupational History   Not on file  Tobacco Use   Smoking status: Every Day    Packs/day: 0.30    Types: Cigarettes   Smokeless tobacco: Never  Vaping Use   Vaping Use: Never used  Substance and Sexual Activity   Alcohol use: Yes   Drug use: Never   Sexual activity: Not on file  Other Topics Concern   Not on file  Social History Narrative   Not on file   Social Determinants of Health   Financial Resource Strain: Not on file  Food Insecurity: Not on file  Transportation Needs: Not on file  Physical Activity: Not on file  Stress: Not on file  Social Connections: Not on file  Intimate Partner Violence: Not on file    Outpatient Medications Prior to Visit  Medication Sig Dispense Refill   ibuprofen (  ADVIL) 800 MG tablet Take 1 tablet (800 mg total) by mouth every 8 (eight) hours as needed. 30 tablet 3   naproxen (NAPROSYN) 500 MG tablet Take 1 tablet (500 mg total) by mouth 2 (two) times daily. 30 tablet 0   cephALEXin (KEFLEX) 500 MG capsule Take 1 capsule (500 mg total) by mouth 4 (four) times daily. 20 capsule 0   Vitamin D, Ergocalciferol, (DRISDOL) 1.25 MG (50000 UT) CAPS capsule Take 1 capsule (50,000 Units total) by mouth every 7 (seven) days. 5 capsule 6   medroxyPROGESTERone (DEPO-PROVERA) injection 150 mg      medroxyPROGESTERone (DEPO-SUBQ PROVERA 104) injection 104  mg      No facility-administered medications prior to visit.    No Known Allergies  Review of Systems  Constitutional:  Negative for chills, fever and malaise/fatigue.  Respiratory:  Negative for cough and shortness of breath.   Cardiovascular:  Negative for chest pain, palpitations and leg swelling.  Gastrointestinal:  Negative for abdominal pain, anorexia, blood in stool, constipation, diarrhea, nausea and vomiting.  Genitourinary:  Positive for missed menses and vaginal bleeding. Negative for dysuria, flank pain, pelvic pain and urgency.       Increased vaginal bleeding  Musculoskeletal:  Negative for back pain.  Skin: Negative.   Psychiatric/Behavioral:  Negative for depression. The patient is not nervous/anxious.   All other systems reviewed and are negative.     Objective:    Physical Exam Exam conducted with a chaperone present.  Constitutional:      General: She is not in acute distress.    Appearance: Normal appearance.  HENT:     Head: Normocephalic.  Cardiovascular:     Rate and Rhythm: Normal rate and regular rhythm.     Pulses: Normal pulses.     Heart sounds: Normal heart sounds.     Comments: No obvious peripheral edema Pulmonary:     Effort: Pulmonary effort is normal.     Breath sounds: Normal breath sounds.  Genitourinary:    Vagina: No foreign body. Bleeding present. No tenderness.     Cervix: Cervical bleeding present. No cervical motion tenderness or lesion.     Uterus: Normal.      Comments: Bleed small to moderate in amount. No visible tissue or clots. Skin:    General: Skin is warm and dry.     Capillary Refill: Capillary refill takes less than 2 seconds.  Neurological:     General: No focal deficit present.     Mental Status: She is alert and oriented to person, place, and time.  Psychiatric:        Mood and Affect: Mood normal.        Behavior: Behavior normal.        Thought Content: Thought content normal.        Judgment: Judgment  normal.    BP (!) 127/94   Temp (!) 97.2 F (36.2 C)   Ht 5\' 5"  (1.651 m)   Wt 192 lb (87.1 kg)   BMI 31.95 kg/m  Wt Readings from Last 3 Encounters:  11/12/20 192 lb (87.1 kg)  05/21/20 190 lb (86.2 kg)  09/16/19 199 lb 0.2 oz (90.3 kg)    Immunization History  Administered Date(s) Administered   Influenza,inj,Quad PF,6+ Mos 12/06/2018    Diabetic Foot Exam - Simple   No data filed     Lab Results  Component Value Date   TSH 3.760 12/06/2018   Lab Results  Component Value Date  WBC 3.4 12/06/2018   HGB 13.2 12/06/2018   HCT 43.2 12/06/2018   MCV 75 (L) 12/06/2018   PLT 334 12/06/2018   Lab Results  Component Value Date   NA 135 12/06/2018   K 4.8 12/06/2018   CO2 21 12/06/2018   GLUCOSE 87 12/06/2018   BUN 13 12/06/2018   CREATININE 0.96 12/06/2018   BILITOT 0.3 12/06/2018   ALKPHOS 70 12/06/2018   AST 29 12/06/2018   ALT 25 12/06/2018   PROT 7.5 12/06/2018   ALBUMIN 4.7 12/06/2018   CALCIUM 10.0 12/06/2018   Lab Results  Component Value Date   CHOL 205 (H) 12/06/2018   Lab Results  Component Value Date   HDL 60 12/06/2018   Lab Results  Component Value Date   LDLCALC 128 (H) 12/06/2018   Lab Results  Component Value Date   TRIG 94 12/06/2018   Lab Results  Component Value Date   CHOLHDL 3.4 12/06/2018   No results found for: HGBA1C     Assessment & Plan:   Problem List Items Addressed This Visit   None Visit Diagnoses     Abnormal uterine bleeding    -  Primary   Relevant Orders   CBC with Differential   POCT urine pregnancy   Comprehensive metabolic panel Informed that bleeding likely related to Depovera prescriptions and prolonged amenorrhea  Discussed warning signs of worsening anemia and when to seek emergency care Will review labs and proceed accordingly  Patient to follow up in 2 mths for IUD    History of iron deficiency anemia       Relevant Orders   Iron, TIBC and Ferritin Panel   Comprehensive metabolic panel        I have discontinued Harrold Donath. Vanderzanden's Vitamin D (Ergocalciferol) and cephALEXin. I am also having her maintain her ibuprofen and naproxen. We will stop administering medroxyPROGESTERone and medroxyPROGESTERone.  No orders of the defined types were placed in this encounter.    Kathrynn Speed, NP

## 2020-11-12 NOTE — Patient Instructions (Signed)
You were seen today for your abnormal uterine bleeding, likely due to [facility] prescription. Labs were completed today, if abnormal you will be notified, otherwise results can be found on your MyChart. Your exam was overall reassuring. Please follow up in 2 mths for IUD placement .

## 2020-11-13 LAB — CBC WITH DIFFERENTIAL/PLATELET
Basophils Absolute: 0 10*3/uL (ref 0.0–0.2)
Basos: 1 %
EOS (ABSOLUTE): 0.1 10*3/uL (ref 0.0–0.4)
Eos: 1 %
Hematocrit: 43.2 % (ref 34.0–46.6)
Hemoglobin: 13.1 g/dL (ref 11.1–15.9)
Immature Grans (Abs): 0 10*3/uL (ref 0.0–0.1)
Immature Granulocytes: 0 %
Lymphocytes Absolute: 1.2 10*3/uL (ref 0.7–3.1)
Lymphs: 31 %
MCH: 23.6 pg — ABNORMAL LOW (ref 26.6–33.0)
MCHC: 30.3 g/dL — ABNORMAL LOW (ref 31.5–35.7)
MCV: 78 fL — ABNORMAL LOW (ref 79–97)
Monocytes Absolute: 0.5 10*3/uL (ref 0.1–0.9)
Monocytes: 12 %
Neutrophils Absolute: 2.1 10*3/uL (ref 1.4–7.0)
Neutrophils: 55 %
Platelets: 404 10*3/uL (ref 150–450)
RBC: 5.55 x10E6/uL — ABNORMAL HIGH (ref 3.77–5.28)
RDW: 15.8 % — ABNORMAL HIGH (ref 11.7–15.4)
WBC: 3.9 10*3/uL (ref 3.4–10.8)

## 2020-11-13 LAB — COMPREHENSIVE METABOLIC PANEL
ALT: 20 IU/L (ref 0–32)
AST: 15 IU/L (ref 0–40)
Albumin/Globulin Ratio: 2.2 (ref 1.2–2.2)
Albumin: 4.6 g/dL (ref 3.8–4.8)
Alkaline Phosphatase: 69 IU/L (ref 44–121)
BUN/Creatinine Ratio: 15 (ref 9–23)
BUN: 15 mg/dL (ref 6–20)
Bilirubin Total: <0.2 mg/dL (ref 0.0–1.2)
CO2: 21 mmol/L (ref 20–29)
Calcium: 9.6 mg/dL (ref 8.7–10.2)
Chloride: 105 mmol/L (ref 96–106)
Creatinine, Ser: 1.03 mg/dL — ABNORMAL HIGH (ref 0.57–1.00)
Globulin, Total: 2.1 g/dL (ref 1.5–4.5)
Glucose: 68 mg/dL (ref 65–99)
Potassium: 4.6 mmol/L (ref 3.5–5.2)
Sodium: 139 mmol/L (ref 134–144)
Total Protein: 6.7 g/dL (ref 6.0–8.5)
eGFR: 71 mL/min/{1.73_m2} (ref >59)

## 2020-11-13 LAB — IRON,TIBC AND FERRITIN PANEL
Ferritin: 148 ng/mL (ref 15–150)
Iron Saturation: 16 % (ref 15–55)
Iron: 58 ug/dL (ref 27–159)
Total Iron Binding Capacity: 359 ug/dL (ref 250–450)
UIBC: 301 ug/dL (ref 131–425)

## 2020-11-13 NOTE — Telephone Encounter (Signed)
Created in error

## 2020-11-30 ENCOUNTER — Ambulatory Visit: Payer: Medicaid Other

## 2020-11-30 ENCOUNTER — Ambulatory Visit: Payer: Medicaid Other | Admitting: Nurse Practitioner

## 2021-01-14 ENCOUNTER — Ambulatory Visit: Payer: Medicaid Other | Admitting: Nurse Practitioner

## 2021-07-22 ENCOUNTER — Ambulatory Visit: Payer: Medicaid Other | Admitting: Nurse Practitioner

## 2022-05-17 ENCOUNTER — Ambulatory Visit
Admission: EM | Admit: 2022-05-17 | Discharge: 2022-05-17 | Disposition: A | Discharge status: Home / Self Care | Payer: Medicaid Other | Attending: Internal Medicine | Admitting: Internal Medicine

## 2022-05-17 DIAGNOSIS — Z113 Encounter for screening for infections with a predominantly sexual mode of transmission: Secondary | ICD-10-CM | POA: Diagnosis not present

## 2022-05-17 DIAGNOSIS — R1084 Generalized abdominal pain: Secondary | ICD-10-CM | POA: Diagnosis not present

## 2022-05-17 DIAGNOSIS — Z3202 Encounter for pregnancy test, result negative: Secondary | ICD-10-CM | POA: Insufficient documentation

## 2022-05-17 DIAGNOSIS — N898 Other specified noninflammatory disorders of vagina: Secondary | ICD-10-CM | POA: Diagnosis not present

## 2022-05-17 LAB — POCT URINALYSIS DIP (MANUAL ENTRY)
Bilirubin, UA: NEGATIVE
Glucose, UA: NEGATIVE mg/dL
Leukocytes, UA: NEGATIVE
Nitrite, UA: NEGATIVE
Protein Ur, POC: NEGATIVE mg/dL
Spec Grav, UA: 1.025 (ref 1.010–1.025)
Urobilinogen, UA: 0.2 E.U./dL
pH, UA: 6 (ref 5.0–8.0)

## 2022-05-17 LAB — POCT URINE PREGNANCY: Preg Test, Ur: NEGATIVE

## 2022-05-17 NOTE — ED Provider Notes (Signed)
[name]    CSN: [id number] Arrival date & time: 05/17/22  [id number]      History   Chief Complaint Chief Complaint  Patient presents with   Abdominal Pain    HPI [name] is a 42 y.o. female.   Patient presents with abdominal pain that started about 1 week ago.  Patient reports abdominal pain is generalized and intermittent.  She reports it is a "gassy pain".  Reports pain is 7/10 on pain scale when it occurs.  Denies nausea, vomiting, diarrhea.  Last bowel movement was this morning and patient denies any blood in stool.  Denies any associated fever.  Denies any obvious aggravating or relieving factors.  Has taken Tylenol for pain with minimal improvement.  Denies dysuria, urinary frequency, hematuria.  Patient reports that did have back pain prior to abdominal pain starting in the left side which is now resolved.  Reports that she noticed a white vaginal discharge that started with symptoms as well.  Denies pelvic pain, abnormal vaginal bleeding.  Reports last menstrual cycle was 04/03/2022.  Denies any exposure to STD but has had unprotected intercourse prior to symptoms starting.  She would like STD testing today including blood work for HIV and syphilis.   Abdominal Pain   Past Medical History:  Diagnosis Date   Appendicitis 2003   Family history of thyroid problem    Fracture of right ankle 1998-1999   History of cholecystectomy 10/2017   Initiation of Depo Provera 12/06/2018   Pap smear for cervical cancer screening 01/05/2019   Vitamin D deficiency 11/2018    Patient Active Problem List   Diagnosis Date Noted   Encounter for Depo-Provera contraception 12/07/2018   OBESITY, NOS 05/21/2006    Past Surgical History:  Procedure Laterality Date   APPENDECTOMY     CHOLECYSTECTOMY      OB History   No obstetric history on file.      Home Medications    Prior to Admission medications   Medication Sig Start Date End Date Taking? Authorizing  Provider  ibuprofen (ADVIL) 800 MG tablet Take 1 tablet (800 mg total) by mouth every 8 (eight) hours as needed. 03/12/20   Azzie Glatter, FNP  naproxen (NAPROSYN) 500 MG tablet Take 1 tablet (500 mg total) by mouth 2 (two) times daily. 05/21/20   Suzy Bouchard, PA-C    Family History Family History  Problem Relation Age of Onset   Hypertension Mother    High Cholesterol Mother    Diabetes Father    Heart failure Father    Thyroid disease Sister    Diabetes Maternal Grandmother     Social History Social History   Tobacco Use   Smoking status: Every Day    Packs/day: 0.30    Types: Cigarettes   Smokeless tobacco: Never  Vaping Use   Vaping Use: Never used  Substance Use Topics   Alcohol use: Yes   Drug use: Never     Allergies   Patient has no known allergies.   Review of Systems Review of Systems Per HPI  Physical Exam Triage Vital Signs ED Triage Vitals  Enc Vitals Group     BP 05/17/22 1050 (!) 134/96     Pulse Rate 05/17/22 1047 98     Resp 05/17/22 1047 16     Temp 05/17/22 1047 97.8 F (36.6 C)     Temp Source 05/17/22 1047 Oral     SpO2 05/17/22 1050 95 %  Weight --      Height --      Head Circumference --      Peak Flow --      Pain Score 05/17/22 1048 6     Pain Loc --      Pain Edu? --      Excl. in [place]? --    No data found.  Updated Vital Signs BP (!) 134/96 (BP Location: Left Arm)   Pulse 98   Temp 97.8 F (36.6 C) (Oral)   Resp 16   SpO2 95%   Visual Acuity Right Eye Distance:   Left Eye Distance:   Bilateral Distance:    Right Eye Near:   Left Eye Near:    Bilateral Near:     Physical Exam Constitutional:      General: She is not in acute distress.    Appearance: Normal appearance. She is not toxic-appearing or diaphoretic.  HENT:     Head: Normocephalic and atraumatic.  Eyes:     Extraocular Movements: Extraocular movements intact.     Conjunctiva/sclera: Conjunctivae normal.  Cardiovascular:     Rate  and Rhythm: Normal rate and regular rhythm.     Pulses: Normal pulses.     Heart sounds: Normal heart sounds.  Pulmonary:     Effort: Pulmonary effort is normal. No respiratory distress.     Breath sounds: Normal breath sounds.  Abdominal:     General: Bowel sounds are normal. There is no distension.     Palpations: Abdomen is soft.     Tenderness: There is abdominal tenderness in the right lower quadrant and suprapubic area.     Hernia: No hernia is present.     Comments: Mild tenderness to palpation to suprapubic area and right lower quadrant.  No rebound tenderness.  No guarding.  Genitourinary:    Comments: Deferred with shared decision making. Self swab performed.  Neurological:     General: No focal deficit present.     Mental Status: She is alert and oriented to person, place, and time. Mental status is at baseline.  Psychiatric:        Mood and Affect: Mood normal.        Behavior: Behavior normal.        Thought Content: Thought content normal.        Judgment: Judgment normal.      UC Treatments / Results  Labs (all labs ordered are listed, but only abnormal results are displayed) Labs Reviewed  POCT URINALYSIS DIP (MANUAL ENTRY) - Abnormal; Notable for the following components:      Result Value   Ketones, POC UA small (15) (*)    Blood, UA trace-intact (*)    All other components within normal limits  RPR  HIV ANTIBODY (ROUTINE TESTING W REFLEX)  CBC  COMPREHENSIVE METABOLIC PANEL  POCT URINE PREGNANCY  CERVICOVAGINAL ANCILLARY ONLY    EKG   Radiology No results found.  Procedures Procedures (including critical care time)  Medications Ordered in UC Medications - No data to display  Initial Impression / Assessment and Plan / UC Course  I have reviewed the triage vital signs and the nursing notes.  Pertinent labs & imaging results that were available during my care of the patient were reviewed by me and considered in my medical decision making (see  chart for details).     Unsure exact etiology of patient's abdominal pain.  Suggested CT imaging at the ER today given location of abdominal  pain but patient declined.  Risks associated with not going to ER were discussed with patient.  Patient voiced understanding and accepted risks.  Patient is sitting upright and there are no obvious signs of acute abdomen so I do think that limited evaluation here in urgent care is reasonable.  Will obtain CMP and CBC.  Suspect patient's abdominal pain could be due to vaginitis given associated vaginal discharge so cervicovaginal swab is pending.  Given no confirmed exposure to STD, will await results for any further treatment.  Urine pregnancy negative.  UA unremarkable.  It does show ketones which could be due to low p.o. intake but CMP will check kidney function.  Advised increasing water intake.  Advised safe over-the-counter pain relievers.  Advised strict return and ER precautions if symptoms persist or worsen.  Patient has gynecology appointment in a few days and encouraged to keep scheduled appointment for follow-up.  Patient verbalized understanding and was agreeable with plan. Final Clinical Impressions(s) / UC Diagnoses   Final diagnoses:  Generalized abdominal pain  Vaginal discharge  Urine pregnancy test negative  Screening examination for venereal disease     Discharge Instructions      Urine pregnancy test was negative.  Urine test was normal.  Vaginal swab and blood work are pending.  Will call if they are abnormal.  Keep scheduled follow-up with gynecology.  Follow-up with the ER if any symptoms persist or worsen.  Please be sure to drink plenty of water.    ED Prescriptions   None    PDMP not reviewed this encounter.   Teodora Medici, [place] 05/17/22 1200

## 2022-05-17 NOTE — ED Triage Notes (Signed)
Pt c/o abd pain, concerned for possible pregnancy. Says it felt like gas at first. Has been eating more than normal.   Also c/o white discharge in underwear, requesting preg test and sti screenings including blood work.

## 2022-05-17 NOTE — Discharge Instructions (Addendum)
Urine pregnancy test was negative.  Urine test was normal.  Vaginal swab and blood work are pending.  Will call if they are abnormal.  Keep scheduled follow-up with gynecology.  Follow-up with the ER if any symptoms persist or worsen.  Please be sure to drink plenty of water.

## 2022-05-18 LAB — CBC
Hematocrit: 44.8 % (ref 34.0–46.6)
Hemoglobin: 13.3 g/dL (ref 11.1–15.9)
MCH: 23.3 pg — ABNORMAL LOW (ref 26.6–33.0)
MCHC: 29.7 g/dL — ABNORMAL LOW (ref 31.5–35.7)
MCV: 79 fL (ref 79–97)
Platelets: 458 10*3/uL — ABNORMAL HIGH (ref 150–450)
RBC: 5.71 x10E6/uL — ABNORMAL HIGH (ref 3.77–5.28)
RDW: 15.3 % (ref 11.7–15.4)
WBC: 5.8 10*3/uL (ref 3.4–10.8)

## 2022-05-18 LAB — COMPREHENSIVE METABOLIC PANEL
ALT: 11 IU/L (ref 0–32)
AST: 14 IU/L (ref 0–40)
Albumin/Globulin Ratio: 2 (ref 1.2–2.2)
Albumin: 4.7 g/dL (ref 3.9–4.9)
Alkaline Phosphatase: 61 IU/L (ref 44–121)
BUN/Creatinine Ratio: 8 — ABNORMAL LOW (ref 9–23)
BUN: 7 mg/dL (ref 6–24)
Bilirubin Total: 0.4 mg/dL (ref 0.0–1.2)
CO2: 18 mmol/L — ABNORMAL LOW (ref 20–29)
Calcium: 9.5 mg/dL (ref 8.7–10.2)
Chloride: 104 mmol/L (ref 96–106)
Creatinine, Ser: 0.85 mg/dL (ref 0.57–1.00)
Globulin, Total: 2.3 g/dL (ref 1.5–4.5)
Glucose: 78 mg/dL (ref 70–99)
Potassium: 4.2 mmol/L (ref 3.5–5.2)
Sodium: 139 mmol/L (ref 134–144)
Total Protein: 7 g/dL (ref 6.0–8.5)
eGFR: 89 mL/min/{1.73_m2} (ref >59)

## 2022-05-18 LAB — HIV ANTIBODY (ROUTINE TESTING W REFLEX): HIV Screen 4th Generation wRfx: NONREACTIVE

## 2022-05-18 LAB — RPR: RPR Ser Ql: NONREACTIVE

## 2022-05-19 ENCOUNTER — Telehealth (HOSPITAL_COMMUNITY): Payer: Self-pay | Admitting: Emergency Medicine

## 2022-05-19 LAB — CERVICOVAGINAL ANCILLARY ONLY
Bacterial Vaginitis (gardnerella): POSITIVE — AB
Candida Glabrata: NEGATIVE
Candida Vaginitis: NEGATIVE
Chlamydia: NEGATIVE
Comment: NEGATIVE
Comment: NEGATIVE
Comment: NEGATIVE
Comment: NEGATIVE
Comment: NEGATIVE
Comment: NORMAL
Neisseria Gonorrhea: NEGATIVE
Trichomonas: POSITIVE — AB

## 2022-05-19 MED ORDER — METRONIDAZOLE 500 MG PO TABS: 500.0000 mg | ORAL_TABLET | Freq: Two times a day (BID) | ORAL | 0 refills | Status: AC

## 2022-05-23 ENCOUNTER — Ambulatory Visit: Payer: Medicaid Other | Admitting: Nurse Practitioner

## 2022-05-26 ENCOUNTER — Ambulatory Visit: Payer: Medicaid Other | Admitting: Nurse Practitioner

## 2022-05-30 ENCOUNTER — Ambulatory Visit: Payer: Medicaid Other | Admitting: Nurse Practitioner

## 2022-06-09 ENCOUNTER — Ambulatory Visit: Payer: Medicaid Other | Admitting: Nurse Practitioner

## 2022-06-22 ENCOUNTER — Ambulatory Visit
Admission: EM | Admit: 2022-06-22 | Discharge: 2022-06-22 | Disposition: A | Discharge status: Home / Self Care | Payer: Medicaid Other | Attending: Physician Assistant | Admitting: Physician Assistant

## 2022-06-22 ENCOUNTER — Other Ambulatory Visit: Payer: Self-pay

## 2022-06-22 DIAGNOSIS — M545 Low back pain, unspecified: Secondary | ICD-10-CM | POA: Diagnosis not present

## 2022-06-22 MED ORDER — METHOCARBAMOL 500 MG PO TABS: 500.0000 mg | ORAL_TABLET | Freq: Four times a day (QID) | ORAL | 0 refills | Status: AC

## 2022-06-22 NOTE — ED Triage Notes (Signed)
Pt presents with lower back pain X 2 days from heavy lifting at work; pt also complains of recurrent diarrhea X 2 days also.

## 2022-06-22 NOTE — Discharge Instructions (Addendum)
Tylenol every 4 hours  

## 2022-06-23 NOTE — ED Provider Notes (Signed)
[name]    CSN: [id number] Arrival date & time: 06/22/22  1508      History   Chief Complaint Chief Complaint  Patient presents with   Back Pain    HPI [name] is a 42 y.o. female.   Patient complains of pain in her low back after heavy lifting at her job.  Patient reports pain is not radiate.  Patient states that she was unable to go to work today due to the pain  The history is provided by the patient. No language interpreter was used.  Back Pain Location:  Lumbar spine Quality:  Aching Pain severity:  Moderate Onset quality:  Gradual Timing:  Constant Progression:  Worsening Ineffective treatments:  None tried   Past Medical History:  Diagnosis Date   Appendicitis 2003   Family history of thyroid problem    Fracture of right ankle 1998-1999   History of cholecystectomy 10/2017   Initiation of Depo Provera 12/06/2018   Pap smear for cervical cancer screening 01/05/2019   Vitamin D deficiency 11/2018    Patient Active Problem List   Diagnosis Date Noted   Encounter for Depo-Provera contraception 12/07/2018   OBESITY, NOS 05/21/2006    Past Surgical History:  Procedure Laterality Date   APPENDECTOMY     CHOLECYSTECTOMY      OB History   No obstetric history on file.      Home Medications    Prior to Admission medications   Medication Sig Start Date End Date Taking? Authorizing Provider  methocarbamol (ROBAXIN) 500 MG tablet Take 1 tablet (500 mg total) by mouth 4 (four) times daily. 06/22/22  Yes Caryl Ada K, PA-C  ibuprofen (ADVIL) 800 MG tablet Take 1 tablet (800 mg total) by mouth every 8 (eight) hours as needed. 03/12/20   Azzie Glatter, FNP  metroNIDAZOLE (FLAGYL) 500 MG tablet Take 1 tablet (500 mg total) by mouth 2 (two) times daily. 05/19/22   Lamptey, Myrene Galas, MD  naproxen (NAPROSYN) 500 MG tablet Take 1 tablet (500 mg total) by mouth 2 (two) times daily. 05/21/20   Suzy Bouchard, PA-C    Family  History Family History  Problem Relation Age of Onset   Hypertension Mother    High Cholesterol Mother    Diabetes Father    Heart failure Father    Thyroid disease Sister    Diabetes Maternal Grandmother     Social History Social History   Tobacco Use   Smoking status: Every Day    Packs/day: .3    Types: Cigarettes   Smokeless tobacco: Never  Vaping Use   Vaping Use: Never used  Substance Use Topics   Alcohol use: Yes   Drug use: Never     Allergies   Patient has no known allergies.   Review of Systems Review of Systems  Musculoskeletal:  Positive for back pain.  All other systems reviewed and are negative.    Physical Exam Triage Vital Signs ED Triage Vitals [06/22/22 1530]  Enc Vitals Group     BP 114/75     Pulse Rate (!) 106     Resp 18     Temp 98.1 F (36.7 C)     Temp Source Oral     SpO2 97 %     Weight      Height      Head Circumference      Peak Flow      Pain Score 8  Pain Loc      Pain Edu?      Excl. in [place]?    No data found.  Updated Vital Signs BP 114/75 (BP Location: Left Arm)   Pulse (!) 106   Temp 98.1 F (36.7 C) (Oral)   Resp 18   SpO2 97%   Visual Acuity Right Eye Distance:   Left Eye Distance:   Bilateral Distance:    Right Eye Near:   Left Eye Near:    Bilateral Near:     Physical Exam Vitals and nursing note reviewed.  Constitutional:      Appearance: She is well-developed.  HENT:     Head: Normocephalic.  Cardiovascular:     Rate and Rhythm: Normal rate and regular rhythm.  Pulmonary:     Effort: Pulmonary effort is normal.  Abdominal:     General: There is no distension.  Musculoskeletal:        General: Tenderness present.     Cervical back: Normal range of motion.     Comments: Diffusely tender lumbar spine,  pain with movement, nv and ns intact   Skin:    General: Skin is warm.  Neurological:     Mental Status: She is alert and oriented to person, place, and time.  Psychiatric:         Mood and Affect: Mood normal.      UC Treatments / Results  Labs (all labs ordered are listed, but only abnormal results are displayed) Labs Reviewed - No data to display  EKG   Radiology No results found.  Procedures Procedures (including critical care time)  Medications Ordered in UC Medications - No data to display  Initial Impression / Assessment and Plan / UC Course  I have reviewed the triage vital signs and the nursing notes.  Pertinent labs & imaging results that were available during my care of the patient were reviewed by me and considered in my medical decision making (see chart for details).      Final Clinical Impressions(s) / UC Diagnoses   Final diagnoses:  Acute midline low back pain without sciatica     Discharge Instructions      Tylenol every 4 hours    ED Prescriptions     Medication Sig Dispense Auth. Provider   methocarbamol (ROBAXIN) 500 MG tablet Take 1 tablet (500 mg total) by mouth 4 (four) times daily. 20 tablet Fransico Meadow, [place]      PDMP not reviewed this encounter. An After Visit Summary was printed and given to the patient.       Fransico Meadow, [place] 06/23/22 1119

## 2022-10-03 ENCOUNTER — Ambulatory Visit
Admission: EM | Admit: 2022-10-03 | Discharge: 2022-10-03 | Disposition: A | Discharge status: Home / Self Care | Payer: Medicaid Other | Attending: Internal Medicine | Admitting: Internal Medicine

## 2022-10-03 ENCOUNTER — Other Ambulatory Visit: Payer: Self-pay

## 2022-10-03 DIAGNOSIS — U071 COVID-19: Secondary | ICD-10-CM | POA: Insufficient documentation

## 2022-10-03 DIAGNOSIS — J069 Acute upper respiratory infection, unspecified: Secondary | ICD-10-CM | POA: Insufficient documentation

## 2022-10-03 MED ORDER — BENZONATATE 100 MG PO CAPS: 100.0000 mg | ORAL_CAPSULE | Freq: Three times a day (TID) | ORAL | 0 refills | Status: AC | PRN

## 2022-10-03 MED ORDER — FLUTICASONE PROPIONATE 50 MCG/ACT NA SUSP: 1.0000 | Freq: Every day | NASAL | 0 refills | Status: AC

## 2022-10-03 NOTE — Discharge Instructions (Addendum)
You have a viral illness that should run its course. I have prescribed you medication to treat symptoms. Ensure adequate fluid hydration and rest.

## 2022-10-03 NOTE — ED Provider Notes (Signed)
[name]    CSN: [id number] Arrival date & time: 10/03/22  0906      History   Chief Complaint Chief Complaint  Patient presents with   Nasal Congestion    HPI MILENE BAKKER is a 42 y.o. female.   Patient presents with 2-day history of nasal congestion, cough, runny nose, watery eyes.  Denies fever but does report some bodyaches.  Denies any known sick contacts but reports that she works at [employer] and has been around a lot of people in public.  Patient does not report any chest pain or shortness of breath.  Has taken cetirizine for symptoms with minimal improvement.  Denies history of asthma.  She does report that she smokes approximately 4 cigarettes a day.     Past Medical History:  Diagnosis Date   Appendicitis 2003   Family history of thyroid problem    Fracture of right ankle 1998-1999   History of cholecystectomy 10/2017   Initiation of Depo Provera 12/06/2018   Pap smear for cervical cancer screening 01/05/2019   Vitamin D deficiency 11/2018    Patient Active Problem List   Diagnosis Date Noted   Encounter for Depo-Provera contraception 12/07/2018   OBESITY, NOS 05/21/2006    Past Surgical History:  Procedure Laterality Date   APPENDECTOMY     CHOLECYSTECTOMY      OB History   No obstetric history on file.      Home Medications    Prior to Admission medications   Medication Sig Start Date End Date Taking? Authorizing Provider  benzonatate (TESSALON) 100 MG capsule Take 1 capsule (100 mg total) by mouth every 8 (eight) hours as needed for cough. 10/03/22  Yes Johnavon Mcclafferty, Acie Fredrickson, FNP  cetirizine (ZYRTEC) 10 MG tablet Take 10 mg by mouth daily.   Yes [provider]  fluticasone (FLONASE) 50 MCG/ACT nasal spray Place 1 spray into both nostrils daily. 10/03/22  Yes Kymberley Raz, Rolly Salter E, FNP  ibuprofen (ADVIL) 800 MG tablet Take 1 tablet (800 mg total) by mouth every 8 (eight) hours as needed. 03/12/20   Kallie Locks, FNP   methocarbamol (ROBAXIN) 500 MG tablet Take 1 tablet (500 mg total) by mouth 4 (four) times daily. 06/22/22   Elson Areas, PA-C  metroNIDAZOLE (FLAGYL) 500 MG tablet Take 1 tablet (500 mg total) by mouth 2 (two) times daily. 05/19/22   Lamptey, Britta Mccreedy, MD  naproxen (NAPROSYN) 500 MG tablet Take 1 tablet (500 mg total) by mouth 2 (two) times daily. 05/21/20   Mannie Stabile, PA-C    Family History Family History  Problem Relation Age of Onset   Hypertension Mother    High Cholesterol Mother    Diabetes Father    Heart failure Father    Thyroid disease Sister    Diabetes Maternal Grandmother     Social History Social History   Tobacco Use   Smoking status: Every Day    Current packs/day: 0.30    Types: Cigarettes   Smokeless tobacco: Never  Vaping Use   Vaping status: Never Used  Substance Use Topics   Alcohol use: Yes   Drug use: Never     Allergies   Patient has no known allergies.   Review of Systems Review of Systems Per HPI  Physical Exam Triage Vital Signs ED Triage Vitals  Encounter Vitals Group     BP 10/03/22 0934 (!) 135/93     Systolic BP Percentile --  Diastolic BP Percentile --      Pulse Rate 10/03/22 0934 88     Resp 10/03/22 0934 18     Temp 10/03/22 0934 98.1 F (36.7 C)     Temp Source 10/03/22 0934 Oral     SpO2 10/03/22 0934 99 %     Weight 10/03/22 0932 172 lb (78 kg)     Height 10/03/22 0932 5\' 4"  (1.626 m)     Head Circumference --      Peak Flow --      Pain Score 10/03/22 0932 0     Pain Loc --      Pain Education --      Exclude from Growth Chart --    No data found.  Updated Vital Signs BP 124/87 (BP Location: Left Arm)   Pulse 86   Temp 98.1 F (36.7 C) (Oral)   Resp 18   Ht 5\' 4"  (1.626 m)   Wt 172 lb (78 kg)   LMP 09/27/2022 (Exact Date)   SpO2 99%   BMI 29.52 kg/m   Visual Acuity Right Eye Distance:   Left Eye Distance:   Bilateral Distance:    Right Eye Near:   Left Eye Near:    Bilateral  Near:     Physical Exam Constitutional:      General: She is not in acute distress.    Appearance: Normal appearance. She is not toxic-appearing or diaphoretic.  HENT:     Head: Normocephalic and atraumatic.     Right Ear: Tympanic membrane and ear canal normal.     Left Ear: Tympanic membrane and ear canal normal.     Nose: Congestion present.     Mouth/Throat:     Mouth: Mucous membranes are moist.     Pharynx: No posterior oropharyngeal erythema.  Eyes:     Extraocular Movements: Extraocular movements intact.     Conjunctiva/sclera: Conjunctivae normal.     Pupils: Pupils are equal, round, and reactive to light.  Cardiovascular:     Rate and Rhythm: Normal rate and regular rhythm.     Pulses: Normal pulses.     Heart sounds: Normal heart sounds.  Pulmonary:     Effort: Pulmonary effort is normal. No respiratory distress.     Breath sounds: Normal breath sounds. No stridor. No wheezing, rhonchi or rales.  Abdominal:     General: Abdomen is flat. Bowel sounds are normal.     Palpations: Abdomen is soft.  Musculoskeletal:        General: Normal range of motion.     Cervical back: Normal range of motion.  Skin:    General: Skin is warm and dry.  Neurological:     General: No focal deficit present.     Mental Status: She is alert and oriented to person, place, and time. Mental status is at baseline.  Psychiatric:        Mood and Affect: Mood normal.        Behavior: Behavior normal.      UC Treatments / Results  Labs (all labs ordered are listed, but only abnormal results are displayed) Labs Reviewed  SARS CORONAVIRUS 2 (TAT 6-24 HRS)    EKG   Radiology No results found.  Procedures Procedures (including critical care time)  Medications Ordered in UC Medications - No data to display  Initial Impression / Assessment and Plan / UC Course  I have reviewed the triage vital signs and the nursing notes.  Pertinent labs &  imaging results that were available  during my care of the patient were reviewed by me and considered in my medical decision making (see chart for details).     Patient presents with symptoms likely from a viral upper respiratory infection. Do not suspect underlying cardiopulmonary process. Symptoms seem unlikely related to ACS, CHF or COPD exacerbations, pneumonia, pneumothorax. Patient is nontoxic appearing and not in need of emergent medical intervention. Covid test pending.   Recommended symptom control with medications and supportive care.   Return if symptoms fail to improve in 1-2 weeks or you develop shortness of breath, chest pain, severe headache. Patient states understanding and is agreeable.  Discharged with PCP followup.  Final Clinical Impressions(s) / UC Diagnoses   Final diagnoses:  Viral upper respiratory tract infection with cough     Discharge Instructions      You have a viral illness that should run its course. I have prescribed you medication to treat symptoms. Ensure adequate fluid hydration and rest.     ED Prescriptions     Medication Sig Dispense Auth. Provider   fluticasone (FLONASE) 50 MCG/ACT nasal spray Place 1 spray into both nostrils daily. 16 g Shams Fill, Rolly Salter E, [place]   benzonatate (TESSALON) 100 MG capsule Take 1 capsule (100 mg total) by mouth every 8 (eight) hours as needed for cough. 21 capsule [place], Acie Fredrickson, [place]      PDMP not reviewed this encounter.   Gustavus Bryant, [place] 10/03/22 1011

## 2022-10-03 NOTE — ED Triage Notes (Signed)
"  I think I have a sinus cold". Having lots of congestion, cough, runny nose and eyes watering. Symptoms began on Wednesday. No fever.

## 2022-10-04 LAB — SARS CORONAVIRUS 2 (TAT 6-24 HRS): SARS Coronavirus 2: POSITIVE — AB

## 2022-10-09 ENCOUNTER — Telehealth (HOSPITAL_COMMUNITY): Payer: Self-pay | Admitting: Emergency Medicine

## 2022-10-09 NOTE — Telephone Encounter (Signed)
Patient called front desk and left a message to be called about her results.  Reviewed with patient, told her no changes were necessary to her habits at this time

## 2023-01-06 ENCOUNTER — Ambulatory Visit: Payer: Self-pay | Admitting: Nurse Practitioner

## 2023-09-04 ENCOUNTER — Ambulatory Visit: Payer: Self-pay
